# Patient Record
Sex: Female | Born: 1995 | Race: White | Hispanic: No | Marital: Single | Smoking: Former smoker
Health system: Southern US, Community
[De-identification: ages and names within clinical notes are randomized; demographics above are authoritative.]

## PROBLEM LIST (undated history)

## (undated) DIAGNOSIS — F419 Anxiety disorder, unspecified: Secondary | ICD-10-CM

## (undated) DIAGNOSIS — F32A Depression, unspecified: Secondary | ICD-10-CM

---
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
## 2019-02-13 ENCOUNTER — Ambulatory Visit: Payer: Self-pay | Admitting: Family Medicine

## 2020-06-12 ENCOUNTER — Other Ambulatory Visit: Payer: Self-pay

## 2020-06-12 ENCOUNTER — Emergency Department (HOSPITAL_COMMUNITY)
Admission: EM | Admit: 2020-06-12 | Discharge: 2020-06-13 | Disposition: A | Discharge status: Other Acute Inpatient Hospital | Payer: Self-pay | Attending: Emergency Medicine | Admitting: Emergency Medicine

## 2020-06-12 ENCOUNTER — Encounter (HOSPITAL_COMMUNITY): Payer: Self-pay

## 2020-06-12 DIAGNOSIS — Z20822 Contact with and (suspected) exposure to covid-19: Secondary | ICD-10-CM | POA: Insufficient documentation

## 2020-06-12 DIAGNOSIS — R45851 Suicidal ideations: Secondary | ICD-10-CM | POA: Insufficient documentation

## 2020-06-12 DIAGNOSIS — T50902A Poisoning by unspecified drugs, medicaments and biological substances, intentional self-harm, initial encounter: Secondary | ICD-10-CM

## 2020-06-12 DIAGNOSIS — T50992A Poisoning by other drugs, medicaments and biological substances, intentional self-harm, initial encounter: Secondary | ICD-10-CM | POA: Insufficient documentation

## 2020-06-12 DIAGNOSIS — F909 Attention-deficit hyperactivity disorder, unspecified type: Secondary | ICD-10-CM | POA: Insufficient documentation

## 2020-06-12 DIAGNOSIS — Z23 Encounter for immunization: Secondary | ICD-10-CM | POA: Insufficient documentation

## 2020-06-12 DIAGNOSIS — Z79899 Other long term (current) drug therapy: Secondary | ICD-10-CM | POA: Insufficient documentation

## 2020-06-12 LAB — COMPREHENSIVE METABOLIC PANEL
ALT: 10 U/L (ref 0–44)
AST: 16 U/L (ref 15–41)
Albumin: 4.2 g/dL (ref 3.5–5.0)
Alkaline Phosphatase: 55 U/L (ref 38–126)
Anion gap: 8 (ref 5–15)
BUN: 11 mg/dL (ref 6–20)
CO2: 24 mmol/L (ref 22–32)
Calcium: 9.2 mg/dL (ref 8.9–10.3)
Chloride: 108 mmol/L (ref 98–111)
Creatinine, Ser: 0.6 mg/dL (ref 0.44–1.00)
GFR calc Af Amer: >60 mL/min (ref >60)
GFR calc non Af Amer: >60 mL/min (ref >60)
Glucose, Bld: 113 mg/dL — ABNORMAL HIGH (ref 70–99)
Potassium: 3.7 mmol/L (ref 3.5–5.1)
Sodium: 140 mmol/L (ref 135–145)
Total Bilirubin: 0.3 mg/dL (ref 0.3–1.2)
Total Protein: 6.8 g/dL (ref 6.5–8.1)

## 2020-06-12 LAB — CBC WITH DIFFERENTIAL/PLATELET
Abs Immature Granulocytes: 0.03 10*3/uL (ref 0.00–0.07)
Basophils Absolute: 0.1 10*3/uL (ref 0.0–0.1)
Basophils Relative: 1 %
Eosinophils Absolute: 0.1 10*3/uL (ref 0.0–0.5)
Eosinophils Relative: 1 %
HCT: 35.3 % — ABNORMAL LOW (ref 36.0–46.0)
Hemoglobin: 11.7 g/dL — ABNORMAL LOW (ref 12.0–15.0)
Immature Granulocytes: 0 %
Lymphocytes Relative: 27 %
Lymphs Abs: 2.5 10*3/uL (ref 0.7–4.0)
MCH: 30.9 pg (ref 26.0–34.0)
MCHC: 33.1 g/dL (ref 30.0–36.0)
MCV: 93.1 fL (ref 80.0–100.0)
Monocytes Absolute: 0.7 10*3/uL (ref 0.1–1.0)
Monocytes Relative: 8 %
Neutro Abs: 5.7 10*3/uL (ref 1.7–7.7)
Neutrophils Relative %: 63 %
Platelets: 258 10*3/uL (ref 150–400)
RBC: 3.79 MIL/uL — ABNORMAL LOW (ref 3.87–5.11)
RDW: 12.1 % (ref 11.5–15.5)
WBC: 9 10*3/uL (ref 4.0–10.5)
nRBC: 0 % (ref 0.0–0.2)

## 2020-06-12 LAB — RAPID URINE DRUG SCREEN, HOSP PERFORMED
Amphetamines: NOT DETECTED
Barbiturates: NOT DETECTED
Benzodiazepines: NOT DETECTED
Cocaine: NOT DETECTED
Opiates: NOT DETECTED
Tetrahydrocannabinol: NOT DETECTED

## 2020-06-12 LAB — SARS CORONAVIRUS 2 BY RT PCR (HOSPITAL ORDER, PERFORMED IN ~~LOC~~ HOSPITAL LAB): SARS Coronavirus 2: NEGATIVE

## 2020-06-12 LAB — I-STAT BETA HCG BLOOD, ED (MC, WL, AP ONLY): I-stat hCG, quantitative: <5 m[IU]/mL (ref <5)

## 2020-06-12 LAB — ETHANOL: Alcohol, Ethyl (B): <10 mg/dL (ref <10)

## 2020-06-12 LAB — ACETAMINOPHEN LEVEL
Acetaminophen (Tylenol), Serum: <10 ug/mL — ABNORMAL LOW (ref 10–30)
Acetaminophen (Tylenol), Serum: <10 ug/mL — ABNORMAL LOW (ref 10–30)

## 2020-06-12 LAB — SALICYLATE LEVEL: Salicylate Lvl: <7 mg/dL — ABNORMAL LOW (ref 7.0–30.0)

## 2020-06-12 MED ORDER — BACITRACIN ZINC 500 UNIT/GM EX OINT
TOPICAL_OINTMENT | Freq: Two times a day (BID) | CUTANEOUS | Status: DC
  Administered 2020-06-12 – 2020-06-13 (×3): 1 via TOPICAL
  Filled 2020-06-12 (×2): qty 0.9

## 2020-06-12 MED ORDER — ACETAMINOPHEN 325 MG PO TABS
650.0000 mg | ORAL_TABLET | Freq: Once | ORAL | Status: AC
  Administered 2020-06-12: 650 mg via ORAL
  Filled 2020-06-12: qty 2

## 2020-06-12 MED ORDER — LORAZEPAM 2 MG/ML IJ SOLN
0.5000 mg | Freq: Once | INTRAMUSCULAR | Status: AC
  Administered 2020-06-12: 0.5 mg via INTRAVENOUS
  Filled 2020-06-12: qty 1

## 2020-06-12 MED ORDER — NICOTINE 7 MG/24HR TD PT24
7.0000 mg | MEDICATED_PATCH | Freq: Once | TRANSDERMAL | Status: AC
  Administered 2020-06-12: 7 mg via TRANSDERMAL
  Filled 2020-06-12: qty 1

## 2020-06-12 MED ORDER — ONDANSETRON HCL 4 MG/2ML IJ SOLN
4.0000 mg | Freq: Once | INTRAMUSCULAR | Status: AC
  Administered 2020-06-12: 4 mg via INTRAVENOUS
  Filled 2020-06-12: qty 2

## 2020-06-12 MED ORDER — SODIUM CHLORIDE 0.9 % IV BOLUS
1000.0000 mL | Freq: Once | INTRAVENOUS | Status: AC
  Administered 2020-06-12: 1000 mL via INTRAVENOUS

## 2020-06-12 MED ORDER — TETANUS-DIPHTH-ACELL PERTUSSIS 5-2.5-18.5 LF-MCG/0.5 IM SUSP
0.5000 mL | Freq: Once | INTRAMUSCULAR | Status: AC
  Administered 2020-06-12: 0.5 mL via INTRAMUSCULAR
  Filled 2020-06-12: qty 0.5

## 2020-06-12 MED ORDER — CHARCOAL ACTIVATED PO LIQD
50.0000 g | Freq: Once | ORAL | Status: AC
  Administered 2020-06-12: 50 g via ORAL
  Filled 2020-06-12: qty 240

## 2020-06-12 NOTE — ED Provider Notes (Signed)
[facility]-EMERGENCY DEPT Provider Note   CSN: [id number] Arrival date & time: 06/12/20  0041     History Chief Complaint  Patient presents with  . Drug Overdose    Kristen Donovan is a 24 y.o. female.  HPI   24 year old female with history of anxiety/depression, ADHD, who presents to the emergency department today for evaluation of intentional overdose.  She has had a really hard week and became overwhelmed tonight so she took a bottle of her vyvanse just PTA. States she is not sure how many she took. Denies coingestions and denies ETOH. Also reports that she cut her left wrist PTA as well. Denies other self harm. Denies any sxs at this time.   Rx filled 12/2019, Vyvanse 40mg .   History reviewed. No pertinent past medical history.  There are no problems to display for this patient.   History reviewed. No pertinent surgical history.   OB History   No obstetric history on file.     No family history on file.  Social History   Tobacco Use  . Smoking status: Never Smoker  Substance Use Topics  . Alcohol use: Not on file  . Drug use: Not on file    Home Medications Prior to Admission medications   Medication Sig Start Date End Date Taking? Authorizing Provider  amphetamine-dextroamphetamine (ADDERALL) 5 MG tablet Take 10 mg by mouth daily.  07/23/19  Yes [provider]  lisdexamfetamine (VYVANSE) 40 MG capsule Take 40 mg by mouth every morning.  07/23/19  Yes [provider]  albuterol (PROVENTIL HFA;VENTOLIN HFA) 108 (90 BASE) MCG/ACT inhaler Inhale 2 puffs into the lungs every 4 (four) hours as needed for wheezing (cough, shortness of breath or wheezing.). Patient not taking: Reported on 06/12/2020 09/27/12   09/29/12, MD  cimetidine (TAGAMET) 400 MG tablet Take 2 tablets (800 mg total) by mouth at bedtime. Patient not taking: Reported on 06/12/2020 09/27/12   09/29/12, MD  cyproheptadine (PERIACTIN) 4 MG tablet  Take 1 tablet (4 mg total) by mouth 4 (four) times daily. Patient not taking: Reported on 06/12/2020 09/27/12   09/29/12, MD  Spacer/Aero-Holding Carmelina Dane Use with MDI as directed Patient not taking: Reported on 06/12/2020 09/27/12   09/29/12, MD    Allergies    Patient has no known allergies.  Review of Systems   Review of Systems  Constitutional: Negative for fever.  HENT: Negative for ear pain and sore throat.   Eyes: Negative for visual disturbance.  Respiratory: Negative for cough and shortness of breath.   Cardiovascular: Negative for chest pain.  Gastrointestinal: Negative for abdominal pain, constipation, diarrhea, nausea and vomiting.  Genitourinary: Negative for dysuria and hematuria.  Musculoskeletal: Negative for back pain.  Skin: Negative for rash.  Neurological: Negative for headaches.  Psychiatric/Behavioral: Positive for self-injury and suicidal ideas.  All other systems reviewed and are negative.   Physical Exam Updated Vital Signs BP (!) 152/90   Pulse 63   Temp 98.2 F (36.8 C) (Oral)   Resp (!) 32   Ht 5\' 5"  (1.651 m)   Wt 61.2 kg   SpO2 98%   BMI 22.47 kg/m   Physical Exam Vitals and nursing note reviewed.  Constitutional:      General: She is not in acute distress.    Appearance: She is well-developed.  HENT:     Head: Normocephalic and atraumatic.  Eyes:     Conjunctiva/sclera: Conjunctivae normal.  Cardiovascular:  Rate and Rhythm: Normal rate and regular rhythm.     Pulses: Normal pulses.     Heart sounds: Normal heart sounds. No murmur heard.   Pulmonary:     Effort: Pulmonary effort is normal. No respiratory distress.     Breath sounds: Normal breath sounds. No wheezing, rhonchi or rales.  Abdominal:     General: Bowel sounds are normal.     Palpations: Abdomen is soft.     Tenderness: There is no abdominal tenderness. There is no guarding or rebound.  Musculoskeletal:     Cervical back: Neck supple.   Skin:    General: Skin is warm and dry.  Neurological:     Mental Status: She is alert.  Psychiatric:        Attention and Perception: Attention normal.        Mood and Affect: Affect is tearful.        Speech: Speech normal.        Behavior: Behavior is cooperative.        Thought Content: Thought content includes suicidal ideation. Thought content does not include homicidal ideation. Thought content includes suicidal plan. Thought content does not include homicidal plan.        Judgment: Judgment is impulsive.     ED Results / Procedures / Treatments   Labs (all labs ordered are listed, but only abnormal results are displayed) Labs Reviewed  CBC WITH DIFFERENTIAL/PLATELET - Abnormal; Notable for the following components:      Result Value   RBC 3.79 (*)    Hemoglobin 11.7 (*)    HCT 35.3 (*)    All other components within normal limits  COMPREHENSIVE METABOLIC PANEL - Abnormal; Notable for the following components:   Glucose, Bld 113 (*)    All other components within normal limits  ACETAMINOPHEN LEVEL - Abnormal; Notable for the following components:   Acetaminophen (Tylenol), Serum <10 (*)    All other components within normal limits  SALICYLATE LEVEL - Abnormal; Notable for the following components:   Salicylate Lvl <7.0 (*)    All other components within normal limits  SARS CORONAVIRUS 2 BY RT PCR (HOSPITAL ORDER, PERFORMED IN [facility])  ETHANOL  RAPID URINE DRUG SCREEN, HOSP PERFORMED  ACETAMINOPHEN LEVEL  I-STAT BETA HCG BLOOD, ED ([facility], [facility], [facility] ONLY)    EKG EKG Interpretation  Date/Time:  Friday June 12 2020 01:19:21 EDT Ventricular Rate:  58 PR Interval:    QRS Duration: 103 QT Interval:  407 QTC Calculation: 400 R Axis:   78 Text Interpretation: Sinus rhythm Atrial premature complex Borderline short PR interval No previous ECGs available Confirmed by Glynn Octave [id number]) on 06/12/2020 1:22:10 AM   Radiology No results  found.  Procedures Procedures (including critical care time) CRITICAL CARE Performed by: Karrie Meres   Total critical care time: 40 minutes  Critical care time was exclusive of separately billable procedures and treating other patients.  Critical care was necessary to treat or prevent imminent or life-threatening deterioration.  Critical care was time spent personally by me on the following activities: development of treatment plan with patient and/or surrogate as well as nursing, discussions with consultants, evaluation of patient's response to treatment, examination of patient, obtaining history from patient or surrogate, ordering and performing treatments and interventions, ordering and review of laboratory studies, ordering and review of radiographic studies, pulse oximetry and re-evaluation of patient's condition.   Medications Ordered in ED Medications  nicotine (NICODERM CQ - dosed  in mg/24 hr) patch 7 mg (7 mg Transdermal Patch Applied 06/12/20 0129)  bacitracin ointment (1 application Topical Given 06/12/20 0249)  sodium chloride 0.9 % bolus 1,000 mL (0 mLs Intravenous Stopped 06/12/20 0242)  charcoal activated (NO SORBITOL) (ACTIDOSE-AQUA) suspension 50 g (50 g Oral Given 06/12/20 0130)  ondansetron (ZOFRAN) injection 4 mg (4 mg Intravenous Given 06/12/20 0148)  Tdap (BOOSTRIX) injection 0.5 mL (0.5 mLs Intramuscular Given 06/12/20 0231)  acetaminophen (TYLENOL) tablet 650 mg (650 mg Oral Given 06/12/20 0253)  LORazepam (ATIVAN) injection 0.5 mg (0.5 mg Intravenous Given 06/12/20 0250)    ED Course  I have reviewed the triage vital signs and the nursing notes.  Pertinent labs & imaging results that were available during my care of the patient were reviewed by me and considered in my medical decision making (see chart for details).    MDM Rules/Calculators/A&P                          24 year old female presenting for evaluation of overdose with Vyvanse prior to arrival and  attempted suicide.  Also cut her left wrist.  12:55 PM Discussed case with Caryn Bee from poison control. He recommends giving a dose of activated charcoal 1g/kg. If EKG WNL can give zofran with it. Monitor for tachycardia, seizures, tremors, agitation. Can give benzos for this if sxs. Recommended aggressive hydration and 8 hour obs until asymptomatic.   Reviewed/interpreted labs. CBC without leukocytosis, mild anemia present CMP within normal limits Acetaminophen negative, salicylate negative EtOH negative UDS negative Beta-hCG negative  230 AM. C/o headache. Neuro exam wnl. BP somewhat elevated. Will order tylenol and ativan.   3:20 AM rechecked pt. Sleeping comfortably but easily arousable. States HA is improved. BP improving. Remains neuro intact.   Multiple rechecks completed and pt continues to be HA free and asymptomatic. HR and BP normalized.   At shift change, pt pending repeat Tylenol level and recheck after 8 hour observation period. If asymptomatic, she can be medically cleared for TTS eval at 830AM. Care transitioned to [facility], PA-C.  Final Clinical Impression(s) / ED Diagnoses Final diagnoses:  Intentional drug overdose, initial encounter [facility])    Rx / DC Orders ED Discharge Orders    None       Rayne Du 06/12/20 0612    Glynn Octave, MD 06/12/20 5366    Glynn Octave, MD 06/12/20 [phone]
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
## 2020-06-12 NOTE — ED Notes (Signed)
Red splotchy rash noted across pts neck and shoulders. Pt has not complaints of itchyness, or pain. Provider notified.

## 2020-06-12 NOTE — ED Notes (Signed)
Spoke to patients mother @ [phone].  Mother would like for patient to call her later.

## 2020-06-12 NOTE — Progress Notes (Signed)
Per Caryn Bee, DNP, this pt requires psychiatric hospitalization at this time.  At [address] [place] calls from [facility].  calls from [facility].  Pt has been accepted to their facility by Dr. Angela Burke to room 814-A. Marland KitchenFredna Dow, concurs with this disposition, as does the pt who is currently under voluntary status.  Pt's nurse will be notified, and agrees to call report to [phone].

## 2020-06-12 NOTE — ED Notes (Signed)
Patients visitor at bedside. Father.

## 2020-06-12 NOTE — BH Assessment (Addendum)
[facility] Assessment Progress Note  Per Caryn Bee, DNP, this pt requires psychiatric hospitalization at this time.  Pt presents under IVC initiated by law enforcement and upheld by EDP Glynn Octave, MD.  The following facilities have been contacted to seek placement for this pt, with results as noted:  Beds available, information sent, decision pending: [facility] Loyal Jacobson, [place] [facility] Coordinator [phone]

## 2020-06-12 NOTE — ED Notes (Addendum)
Patient belong bag-1 placed in 16-18 cabinets.   IVC paper work at nurses station in [employer] B bin.

## 2020-06-12 NOTE — ED Notes (Signed)
Patient speaking with TSS at this time. Patient calm and cooperative.    Spoke with patients father and updated on plan of care.

## 2020-06-12 NOTE — ED Provider Notes (Signed)
Pt is a 24 y/o female that was transferred to me at shift change from [employer]. Her HPI is below:  24 year old female with history of anxiety/depression, ADHD, who presents to the emergency department today for evaluation of intentional overdose.  She has had a really hard week and became overwhelmed tonight so she took a bottle of her vyvanse just PTA. States she is not sure how many she took. Denies coingestions and denies ETOH. Also reports that she cut her left wrist PTA as well. Denies other self harm. Denies any sxs at this time.   Rx filled 12/2019, Vyvanse 40mg .   Physical Exam  BP 140/82    Pulse 76    Temp 98.2 F (36.8 C) (Oral)    Resp 14    Ht 5\' 5"  (1.651 m)    Wt 61.2 kg    SpO2 100%    BMI 22.47 kg/m   Physical Exam Physical Exam Vitals and nursing note reviewed.  Constitutional:      General: She is not in acute distress.    Appearance: She is well-developed.  HENT:     Head: Normocephalic and atraumatic.  Eyes:     Conjunctiva/sclera: Conjunctivae normal.  Cardiovascular:     Rate and Rhythm: Normal rate and regular rhythm.     Pulses: Normal pulses.     Heart sounds: Normal heart sounds. No murmur heard.   Pulmonary:     Effort: Pulmonary effort is normal. No respiratory distress.     Breath sounds: Normal breath sounds. No wheezing, rhonchi or rales.  Abdominal:     General: Bowel sounds are normal.     Palpations: Abdomen is soft.     Tenderness: There is no abdominal tenderness. There is no guarding or rebound.  Musculoskeletal:     Cervical back: Neck supple.  Skin:    General: Skin is warm and dry.  Neurological:     Mental Status: She is alert.  Psychiatric:        Attention and Perception: Attention normal.        Mood and Affect: Affect is tearful.        Speech: Speech normal.        Behavior: Behavior is cooperative.        Thought Content: Thought content includes suicidal ideation. Thought content does not include homicidal  ideation. Thought content includes suicidal plan. Thought content does not include homicidal plan.        Judgment: Judgment is impulsive.  ED Course/Procedures     Procedures  MDM  Patient is a 24 year old female that was transferred to me at shift change from .  Poison control was contacted regarding this patient and she was monitored in the emergency department at their request.  No elevation in her acetaminophen level x2.  Patient was initially hypertensive and tachycardic, which is since improved.  She is an IVC patient.  After she was medically cleared, I consulted TTS.  They assessed the patient and 21, NP has reviewed the patient's information and determined that she meets inpatient criteria.  She is currently being reviewed at [facility].   Note: Portions of this report may have been transcribed using voice recognition software. Every effort was made to ensure accuracy; however, inadvertent computerized transcription errors may be present.        Malachy Chamber, PA-C 06/12/20 1034    Placido Sou, MD 06/12/20 1539

## 2020-06-12 NOTE — ED Notes (Signed)
Buffi Ewton, mother, [phone] wants an update on her daughter. Has not been contacted as of yet.

## 2020-06-12 NOTE — ED Notes (Signed)
Spoke with patients father who would like to be notified once patient is transferred to inpatient [facility].   [phone] Laddie Aquas, Father).

## 2020-06-12 NOTE — ED Notes (Signed)
Patient speaking with mother on the phone.

## 2020-06-12 NOTE — ED Notes (Signed)
Father at bedside visiting patient. Patient cam and cooperative at this time.   Patients visitor (Father) wanded and belongings placed in cabinet by security.

## 2020-06-12 NOTE — ED Notes (Signed)
Sheriff was called for transport to [facility] and I was told that they could not transport until morning.  They asked that we call during the night to leave a message for the morning transport.
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
## 2020-06-12 NOTE — ED Notes (Addendum)
Poison Control states that if patient has a level of tylenol of 150 mg that she can be cleared.

## 2020-06-12 NOTE — Progress Notes (Signed)
CSW updated [facility] pt cannot transport until morning.  [facility] voiced understanding.  CSW will continue to follow for D/C needs.  Dorothe Pea. Amadi Yoshino  MSW, LCSW, LCAS, CCS Transitions of Care Clinical Social Worker Care Coordination Department Ph: [phone]

## 2020-06-13 MED ORDER — NICOTINE 7 MG/24HR TD PT24
7.0000 mg | MEDICATED_PATCH | Freq: Once | TRANSDERMAL | Status: DC
  Administered 2020-06-13: 7 mg via TRANSDERMAL
  Filled 2020-06-13: qty 1

## 2020-06-13 NOTE — Progress Notes (Signed)
06/13/2020  0735  Called Sheriff [phone] to transport patient to [facility], message left on voicemail. Waiting for return call from sheriff dept.

## 2020-06-13 NOTE — Progress Notes (Signed)
06/13/2020  3709  Sheriff called stating they will be here in 30 minutes to transport patient to [facility].

## 2020-06-13 NOTE — ED Notes (Signed)
Transport called and message left for patient transport in AM, Pending [facility] hospital placement.

## 2020-06-13 NOTE — Progress Notes (Signed)
06/13/2020  6599  Called report to [facility] [phone]. Report given to [facility].

## 2020-08-25 ENCOUNTER — Emergency Department (HOSPITAL_COMMUNITY): Payer: Commercial Managed Care - PPO

## 2020-08-25 ENCOUNTER — Encounter (HOSPITAL_COMMUNITY): Payer: Self-pay

## 2020-08-25 ENCOUNTER — Emergency Department (HOSPITAL_COMMUNITY)
Admission: EM | Admit: 2020-08-25 | Discharge: 2020-08-25 | Disposition: A | Discharge status: Home / Self Care | Payer: Commercial Managed Care - PPO | Attending: Emergency Medicine | Admitting: Emergency Medicine

## 2020-08-25 ENCOUNTER — Other Ambulatory Visit: Payer: Self-pay

## 2020-08-25 DIAGNOSIS — Z20822 Contact with and (suspected) exposure to covid-19: Secondary | ICD-10-CM | POA: Insufficient documentation

## 2020-08-25 DIAGNOSIS — N3000 Acute cystitis without hematuria: Secondary | ICD-10-CM | POA: Diagnosis not present

## 2020-08-25 DIAGNOSIS — R1031 Right lower quadrant pain: Secondary | ICD-10-CM | POA: Diagnosis present

## 2020-08-25 LAB — URINALYSIS, ROUTINE W REFLEX MICROSCOPIC
Bilirubin Urine: NEGATIVE
Glucose, UA: NEGATIVE mg/dL
Ketones, ur: 5 mg/dL — AB
Nitrite: POSITIVE — AB
Protein, ur: 100 mg/dL — AB
Specific Gravity, Urine: 1.012 (ref 1.005–1.030)
pH: 7 (ref 5.0–8.0)

## 2020-08-25 LAB — CBC
HCT: 39.9 % (ref 36.0–46.0)
Hemoglobin: 13.1 g/dL (ref 12.0–15.0)
MCH: 30.8 pg (ref 26.0–34.0)
MCHC: 32.8 g/dL (ref 30.0–36.0)
MCV: 93.9 fL (ref 80.0–100.0)
Platelets: 279 10*3/uL (ref 150–400)
RBC: 4.25 MIL/uL (ref 3.87–5.11)
RDW: 12 % (ref 11.5–15.5)
WBC: 7.9 10*3/uL (ref 4.0–10.5)
nRBC: 0 % (ref 0.0–0.2)

## 2020-08-25 LAB — COMPREHENSIVE METABOLIC PANEL
ALT: 15 U/L (ref 0–44)
AST: 19 U/L (ref 15–41)
Albumin: 4.9 g/dL (ref 3.5–5.0)
Alkaline Phosphatase: 73 U/L (ref 38–126)
Anion gap: 10 (ref 5–15)
BUN: 9 mg/dL (ref 6–20)
CO2: 24 mmol/L (ref 22–32)
Calcium: 9.4 mg/dL (ref 8.9–10.3)
Chloride: 104 mmol/L (ref 98–111)
Creatinine, Ser: 0.64 mg/dL (ref 0.44–1.00)
GFR calc Af Amer: >60 mL/min (ref >60)
GFR calc non Af Amer: >60 mL/min (ref >60)
Glucose, Bld: 94 mg/dL (ref 70–99)
Potassium: 3.7 mmol/L (ref 3.5–5.1)
Sodium: 138 mmol/L (ref 135–145)
Total Bilirubin: 0.5 mg/dL (ref 0.3–1.2)
Total Protein: 8.3 g/dL — ABNORMAL HIGH (ref 6.5–8.1)

## 2020-08-25 LAB — I-STAT BETA HCG BLOOD, ED (MC, WL, AP ONLY): I-stat hCG, quantitative: <5 m[IU]/mL (ref <5)

## 2020-08-25 LAB — LIPASE, BLOOD: Lipase: 28 U/L (ref 11–51)

## 2020-08-25 LAB — SARS CORONAVIRUS 2 BY RT PCR (HOSPITAL ORDER, PERFORMED IN ~~LOC~~ HOSPITAL LAB): SARS Coronavirus 2: NEGATIVE

## 2020-08-25 MED ORDER — PHENAZOPYRIDINE HCL 95 MG PO TABS: 95.0000 mg | ORAL_TABLET | Freq: Three times a day (TID) | ORAL | 0 refills | Status: DC | PRN

## 2020-08-25 MED ORDER — ONDANSETRON HCL 4 MG/2ML IJ SOLN
4.0000 mg | Freq: Once | INTRAMUSCULAR | Status: AC
  Administered 2020-08-25: 4 mg via INTRAVENOUS
  Filled 2020-08-25: qty 2

## 2020-08-25 MED ORDER — CEPHALEXIN 500 MG PO CAPS: 500.0000 mg | ORAL_CAPSULE | Freq: Three times a day (TID) | ORAL | 0 refills | Status: AC

## 2020-08-25 MED ORDER — MORPHINE SULFATE (PF) 4 MG/ML IV SOLN
4.0000 mg | Freq: Once | INTRAVENOUS | Status: AC
  Administered 2020-08-25: 4 mg via INTRAVENOUS
  Filled 2020-08-25: qty 1

## 2020-08-25 MED ORDER — ONDANSETRON 4 MG PO TBDP: 4.0000 mg | ORAL_TABLET | Freq: Three times a day (TID) | ORAL | 0 refills | Status: DC | PRN

## 2020-08-25 MED ORDER — KETOROLAC TROMETHAMINE 30 MG/ML IJ SOLN
30.0000 mg | Freq: Once | INTRAMUSCULAR | Status: AC
  Administered 2020-08-25: 30 mg via INTRAVENOUS
  Filled 2020-08-25: qty 1

## 2020-08-25 MED ORDER — CEPHALEXIN 500 MG PO CAPS
500.0000 mg | ORAL_CAPSULE | Freq: Once | ORAL | Status: AC
  Administered 2020-08-25: 500 mg via ORAL
  Filled 2020-08-25: qty 1

## 2020-08-25 MED ORDER — IOHEXOL 300 MG/ML  SOLN
100.0000 mL | Freq: Once | INTRAMUSCULAR | Status: AC | PRN
  Administered 2020-08-25: 100 mL via INTRAVENOUS

## 2020-08-25 MED ORDER — SODIUM CHLORIDE 0.9 % IV BOLUS
1000.0000 mL | Freq: Once | INTRAVENOUS | Status: AC
  Administered 2020-08-25: 1000 mL via INTRAVENOUS

## 2020-08-25 NOTE — Discharge Instructions (Signed)
Take the mediation as prescribed.  Follow up with Primary care provider  Return for new or worsening symptoms

## 2020-08-25 NOTE — ED Triage Notes (Signed)
Pt presents with c/o right lower quadrant abdominal pain since yesterday. Pt went to UC and was sent here to rule out appendicitis.

## 2020-08-25 NOTE — ED Provider Notes (Signed)
[facility]-EMERGENCY DEPT Provider Note   CSN: [id number] Arrival date & time: 08/25/20  1426    History Chief Complaint  Patient presents with  . Abdominal Pain    Kristen Donovan is a 24 y.o. female with past medical history significant for intentional overdose 2 months ago who presents for evaluation of abdominal pain.  Pain located to right lower quadrant. Began yesterday. Seen by UC and sent by UC to r/o appendicitis. Rates pain a 8/10.  Patient thought she had a urinary tract infection yesterday due to the pain.  She does not have a history of urinary tract infections.  She denies any dysuria, urinary frequency, hematuria.  States she did take 1 dose of Azo yesterday.  She is sexually active.  Denies chance of pregnancy.  Denies any pelvic pain, vaginal discharge, denies any concerns for STDs.  Pain is constant in nature.  States will occasionally radiate into her lower back.  Patient states her pain is located between her upper and lower quadrants on the right-hand side.  She has never had anything like this previously.  Had 1 episode of NBNB emesis which she states was due to the pain yesterday.  No prior abdominal surgeries.  Her last bowel movement was this morning.  She denies any diarrhea or constipation.  She is passing flatulence.  Denies fever, chills, chest pain, shortness of breath, rashes, lesions.  Denies additional aggravating or alleviating factors.  History obtained from patient and past medical records.  No interpreter used.  HPI     History reviewed. No pertinent past medical history.  There are no problems to display for this patient.   History reviewed. No pertinent surgical history.   OB History   No obstetric history on file.     History reviewed. No pertinent family history.  Social History   Tobacco Use  . Smoking status: Never Smoker  Substance Use Topics  . Alcohol use: Not on file  . Drug use: Not on file    Home  Medications Prior to Admission medications   Medication Sig Start Date End Date Taking? Authorizing Provider  buPROPion (WELLBUTRIN XL) 150 MG 24 hr tablet Take 150 mg by mouth daily.   Yes [provider]  albuterol (PROVENTIL HFA;VENTOLIN HFA) 108 (90 BASE) MCG/ACT inhaler Inhale 2 puffs into the lungs every 4 (four) hours as needed for wheezing (cough, shortness of breath or wheezing.). Patient not taking: Reported on 06/12/2020 09/27/12   Carmelina Dane, MD  cephALEXin (KEFLEX) 500 MG capsule Take 1 capsule (500 mg total) by mouth 3 (three) times daily for 7 days. 08/25/20 09/01/20  Loralye Loberg A, PA-C  cimetidine (TAGAMET) 400 MG tablet Take 2 tablets (800 mg total) by mouth at bedtime. Patient not taking: Reported on 06/12/2020 09/27/12   Carmelina Dane, MD  cyproheptadine (PERIACTIN) 4 MG tablet Take 1 tablet (4 mg total) by mouth 4 (four) times daily. Patient not taking: Reported on 06/12/2020 09/27/12   Carmelina Dane, MD  ondansetron (ZOFRAN ODT) 4 MG disintegrating tablet Take 1 tablet (4 mg total) by mouth every 8 (eight) hours as needed for nausea or vomiting. 08/25/20   Orel Hord A, PA-C  phenazopyridine (PYRIDIUM) 95 MG tablet Take 1 tablet (95 mg total) by mouth 3 (three) times daily as needed for pain. 08/25/20   Monique Gift A, PA-C  Spacer/Aero-Holding Rudean Curt Use with MDI as directed Patient not taking: Reported on 06/12/2020 09/27/12   Carmelina Dane,  MD    Allergies    Patient has no known allergies.  Review of Systems   Review of Systems  Constitutional: Negative.   HENT: Negative.   Respiratory: Negative.   Cardiovascular: Negative.   Gastrointestinal: Positive for abdominal pain, nausea and vomiting. Negative for abdominal distention, anal bleeding, blood in stool, constipation, diarrhea and rectal pain.  Genitourinary: Negative.   Musculoskeletal: Negative.   Skin: Negative.   Neurological: Negative.   All other systems  reviewed and are negative.   Physical Exam Updated Vital Signs BP 132/78 (BP Location: Left Arm)   Pulse 91   Temp 98.6 F (37 C) (Oral)   Resp 18   LMP 08/11/2020 (Approximate)   SpO2 100%   Physical Exam Vitals and nursing note reviewed.  Constitutional:      General: She is not in acute distress.    Appearance: She is well-developed. She is not ill-appearing, toxic-appearing or diaphoretic.  HENT:     Head: Normocephalic and atraumatic.     Mouth/Throat:     Mouth: Mucous membranes are moist.  Eyes:     Pupils: Pupils are equal, round, and reactive to light.  Cardiovascular:     Rate and Rhythm: Normal rate.     Heart sounds: Normal heart sounds.  Pulmonary:     Effort: Pulmonary effort is normal. No respiratory distress.     Breath sounds: Normal breath sounds.     Comments: Speaks in full sentences without difficulty. Lungs clear to auscultation bilaterally. Abdominal:     General: Bowel sounds are normal. There is no distension.     Palpations: Abdomen is soft.     Tenderness: There is abdominal tenderness in the right lower quadrant. There is no right CVA tenderness, left CVA tenderness, guarding or rebound. Negative signs include Murphy's sign.     Comments: Tenderness to RLQ and mid right abdomen diffusely. No rebound or guarding. No gross abd wall hernias.  Musculoskeletal:        General: Normal range of motion.     Cervical back: Normal range of motion.     Comments: Moves all 4 extremities without difficulty. Compartments soft. No bony tenderness  Skin:    General: Skin is warm and dry.     Capillary Refill: Capillary refill takes less than 2 seconds.     Comments: Scattered ecchymosis appear old to extremities. No edema, erythema, warmth  Neurological:     General: No focal deficit present.     Mental Status: She is alert.     Gait: Gait is intact.     Comments: CN 2-12 grossly intact. Ambulatory wo difficulty    ED Results / Procedures / Treatments    Labs (all labs ordered are listed, but only abnormal results are displayed) Labs Reviewed  COMPREHENSIVE METABOLIC PANEL - Abnormal; Notable for the following components:      Result Value   Total Protein 8.3 (*)    All other components within normal limits  URINALYSIS, ROUTINE W REFLEX MICROSCOPIC - Abnormal; Notable for the following components:   Color, Urine AMBER (*)    Hgb urine dipstick SMALL (*)    Ketones, ur 5 (*)    Protein, ur 100 (*)    Nitrite POSITIVE (*)    Leukocytes,Ua MODERATE (*)    Bacteria, UA FEW (*)    All other components within normal limits  SARS CORONAVIRUS 2 BY RT PCR (HOSPITAL ORDER, PERFORMED IN [facility])  LIPASE, BLOOD  CBC  I-STAT BETA HCG BLOOD, ED ([facility], [facility], [facility] ONLY)    EKG None  Radiology US Transvaginal Non-OB  Result Date: 08/25/2020 CLINICAL DATA:  Right lower quadrant pain EXAM: TRANSABDOMINAL AND TRANSVAGINAL ULTRASOUND OF PELVIS DOPPLER ULTRASOUND OF OVARIES TECHNIQUE: Both transabdominal and transvaginal ultrasound examinations of the pelvis were performed. Transabdominal technique was performed for global imaging of the pelvis including uterus, ovaries, adnexal regions, and pelvic cul-de-sac. It was necessary to proceed with endovaginal exam following the transabdominal exam to visualize the ovaries. Color and duplex Doppler ultrasound was utilized to evaluate blood flow to the ovaries. COMPARISON:  CT dated 08/25/2020 FINDINGS: Uterus Measurements: 7.2 x 2.4 x 3.2 cm = volume: 32 mL. No fibroids or other mass visualized. Endometrium Thickness: 6 mm.  No focal abnormality visualized. Right ovary Measurements: 3.8 x 2 x 1.8 cm = volume: 7.1 mL. Normal appearance/no adnexal mass. Left ovary Measurements: 3.5 x 2.4 x 2.8 cm = volume: 12.2 mL. Normal appearance/no adnexal mass. Pulsed Doppler evaluation of both ovaries demonstrates normal low-resistance arterial and venous waveforms. Other findings No abnormal free fluid. IMPRESSION:  Normal study. Electronically Signed   By: Katherine Mantle M.D.   On: 08/25/2020 18:41   US Pelvis Complete  Result Date: 08/25/2020 CLINICAL DATA:  Right lower quadrant pain EXAM: TRANSABDOMINAL AND TRANSVAGINAL ULTRASOUND OF PELVIS DOPPLER ULTRASOUND OF OVARIES TECHNIQUE: Both transabdominal and transvaginal ultrasound examinations of the pelvis were performed. Transabdominal technique was performed for global imaging of the pelvis including uterus, ovaries, adnexal regions, and pelvic cul-de-sac. It was necessary to proceed with endovaginal exam following the transabdominal exam to visualize the ovaries. Color and duplex Doppler ultrasound was utilized to evaluate blood flow to the ovaries. COMPARISON:  CT dated 08/25/2020 FINDINGS: Uterus Measurements: 7.2 x 2.4 x 3.2 cm = volume: 32 mL. No fibroids or other mass visualized. Endometrium Thickness: 6 mm.  No focal abnormality visualized. Right ovary Measurements: 3.8 x 2 x 1.8 cm = volume: 7.1 mL. Normal appearance/no adnexal mass. Left ovary Measurements: 3.5 x 2.4 x 2.8 cm = volume: 12.2 mL. Normal appearance/no adnexal mass. Pulsed Doppler evaluation of both ovaries demonstrates normal low-resistance arterial and venous waveforms. Other findings No abnormal free fluid. IMPRESSION: Normal study. Electronically Signed   By: Katherine Mantle M.D.   On: 08/25/2020 18:41   CT Abdomen Pelvis W Contrast  Result Date: 08/25/2020 CLINICAL DATA:  Right lower quadrant abdominal pain. EXAM: CT ABDOMEN AND PELVIS WITH CONTRAST TECHNIQUE: Multidetector CT imaging of the abdomen and pelvis was performed using the standard protocol following bolus administration of intravenous contrast. CONTRAST:  OMNIPAQUE IOHEXOL 300 MG/ML  SOLN COMPARISON:  June 13, 2016 FINDINGS: Lower chest: No acute abnormality. Hepatobiliary: No focal liver abnormality is seen. No gallstones, gallbladder wall thickening, or biliary dilatation. Pancreas: Unremarkable. No pancreatic  ductal dilatation or surrounding inflammatory changes. Spleen: Normal in size without focal abnormality. Adrenals/Urinary Tract: Adrenal glands are unremarkable. Kidneys are normal, without renal calculi, focal lesion, or hydronephrosis. Bladder is unremarkable. Stomach/Bowel: Stomach is within normal limits. Appendix appears normal. No evidence of bowel wall thickening, distention, or inflammatory changes. Vascular/Lymphatic: No significant vascular findings are present. No enlarged abdominal or pelvic lymph nodes. Reproductive: The uterus is normal in appearance. Multiple subcentimeter cysts are seen within the bilateral adnexa. Other: No abdominal wall hernia or abnormality. No abdominopelvic ascites. Musculoskeletal: No acute or significant osseous findings. IMPRESSION: 1. No CT evidence of acute or active process within the abdomen or pelvis. 2. Multiple subcentimeter  cysts within the bilateral adnexa, likely ovarian in origin. Electronically Signed   By: Aram Candela M.D.   On: 08/25/2020 17:36   [place] Art/Ven Flow Abd Pelv Doppler  Result Date: 08/25/2020 CLINICAL DATA:  Right lower quadrant pain EXAM: TRANSABDOMINAL AND TRANSVAGINAL ULTRASOUND OF PELVIS DOPPLER ULTRASOUND OF OVARIES TECHNIQUE: Both transabdominal and transvaginal ultrasound examinations of the pelvis were performed. Transabdominal technique was performed for global imaging of the pelvis including uterus, ovaries, adnexal regions, and pelvic cul-de-sac. It was necessary to proceed with endovaginal exam following the transabdominal exam to visualize the ovaries. Color and duplex Doppler ultrasound was utilized to evaluate blood flow to the ovaries. COMPARISON:  CT dated 08/25/2020 FINDINGS: Uterus Measurements: 7.2 x 2.4 x 3.2 cm = volume: 32 mL. No fibroids or other mass visualized. Endometrium Thickness: 6 mm.  No focal abnormality visualized. Right ovary Measurements: 3.8 x 2 x 1.8 cm = volume: 7.1 mL. Normal appearance/no adnexal  mass. Left ovary Measurements: 3.5 x 2.4 x 2.8 cm = volume: 12.2 mL. Normal appearance/no adnexal mass. Pulsed Doppler evaluation of both ovaries demonstrates normal low-resistance arterial and venous waveforms. Other findings No abnormal free fluid. IMPRESSION: Normal study. Electronically Signed   By: Katherine Mantle M.D.   On: 08/25/2020 18:41    Procedures Procedures (including critical care time)  Medications Ordered in ED Medications  sodium chloride 0.9 % bolus 1,000 mL (0 mLs Intravenous Stopped 08/25/20 1828)  morphine 4 MG/ML injection 4 mg (4 mg Intravenous Given 08/25/20 1537)  ondansetron (ZOFRAN) injection 4 mg (4 mg Intravenous Given 08/25/20 1550)  iohexol (OMNIPAQUE) 300 MG/ML solution 100 mL (100 mLs Intravenous Contrast Given 08/25/20 1719)  ketorolac (TORADOL) 30 MG/ML injection 30 mg (30 mg Intravenous Given 08/25/20 1930)  cephALEXin (KEFLEX) capsule 500 mg (500 mg Oral Given 08/25/20 1930)    ED Course  I have reviewed the triage vital signs and the nursing notes.  Pertinent labs & imaging results that were available during my care of the patient were reviewed by me and considered in my medical decision making (see chart for details).  24 year old presents for evaluation of right-sided abdominal pain which began yesterday.  She is afebrile, nonseptic, non-ill-appearing.  Seen by urgent care sent here to the emergency department to rule out appendicitis.  Patient states sexually active however denies chance of pregnancy.  No pelvic pain, vaginal discharge, she denies any concerns for any STDs.  Has been taking Azo at home.  Her heart and lungs are clear.  Her abdomen is diffusely tender to her right upper and lower quadrant however has negative Murphy sign.  She is neurovascularly intact.  Ambulatory without difficulty.  She does have scattered areas of ecchymosis however patient states "I bruise easily."  She denies any trauma or injuries. Plan on labs, imaging and  reassess.  Labs and imaging personally reviewed and interpreted:  CBC without leukocytosis Lipase 28 CMP no electrolyte, renal abnormality Preg negative UA positive for UTI COVID negative CT AP negative for appendicitis, bilateral adnexal cyst Ultrasound without evidence of torsion.  Patient reassessed. Pain still a 7/10 however declines additional pain meds. Patient does states that pain is now lower into her pelvis on right. Will add [place] to r/o torsion. She does not want pelvic exam at this time to r/o STD.  Patient reassessed.  Pain controlled.  She is tolerating p.o. intake without difficulty.  Pain likely related to her urinary tract infection.  CT scan and ultrasound reassuring.  Again does not  want pelvic exam to rule out STDs.  She states she will follow-up outpatient for this.  DC home with UTI and symptomatic management.  She will return for any worsening symptoms.  Patient is nontoxic, nonseptic appearing, in no apparent distress.  Patient's pain and other symptoms adequately managed in emergency department.  Fluid bolus given.  Labs, imaging and vitals reviewed.  Patient does not meet the SIRS or Sepsis criteria.  On repeat exam patient does not have a surgical abdomin and there are no peritoneal signs.  No indication of appendicitis, bowel obstruction, bowel perforation, cholecystitis, diverticulitis, PID, torsion or ectopic pregnancy.  Patient discharged home with symptomatic treatment and given strict instructions for follow-up with their primary care physician.  I have also discussed reasons to return immediately to the ER.  Patient expresses understanding and agrees with plan.     MDM Rules/Calculators/A&P                           Final Clinical Impression(s) / ED Diagnoses Final diagnoses:  Acute cystitis without hematuria    Rx / DC Orders ED Discharge Orders         Ordered    cephALEXin (KEFLEX) 500 MG capsule  3 times daily        08/25/20 1935     phenazopyridine (PYRIDIUM) 95 MG tablet  3 times daily PRN        08/25/20 1935    ondansetron (ZOFRAN ODT) 4 MG disintegrating tablet  Every 8 hours PRN        08/25/20 1937           Aidan Caloca A, PA-C 08/25/20 2235    Rolan BuccoBelfi, Melanie, MD 08/25/20 2245

## 2020-11-19 ENCOUNTER — Other Ambulatory Visit: Payer: Self-pay

## 2020-11-19 ENCOUNTER — Emergency Department (HOSPITAL_COMMUNITY)
Admission: EM | Admit: 2020-11-19 | Discharge: 2020-11-20 | Disposition: A | Discharge status: Home / Self Care | Payer: Commercial Managed Care - PPO | Attending: Emergency Medicine | Admitting: Emergency Medicine

## 2020-11-19 DIAGNOSIS — E860 Dehydration: Secondary | ICD-10-CM | POA: Insufficient documentation

## 2020-11-19 DIAGNOSIS — N72 Inflammatory disease of cervix uteri: Secondary | ICD-10-CM | POA: Insufficient documentation

## 2020-11-19 DIAGNOSIS — R1031 Right lower quadrant pain: Secondary | ICD-10-CM | POA: Diagnosis present

## 2020-11-19 LAB — URINALYSIS, ROUTINE W REFLEX MICROSCOPIC
Bilirubin Urine: NEGATIVE
Glucose, UA: NEGATIVE mg/dL
Ketones, ur: 80 mg/dL — AB
Nitrite: NEGATIVE
Protein, ur: 100 mg/dL — AB
Specific Gravity, Urine: 1.032 — ABNORMAL HIGH (ref 1.005–1.030)
pH: 5 (ref 5.0–8.0)

## 2020-11-19 LAB — COMPREHENSIVE METABOLIC PANEL
ALT: 17 U/L (ref 0–44)
AST: 21 U/L (ref 15–41)
Albumin: 4.5 g/dL (ref 3.5–5.0)
Alkaline Phosphatase: 47 U/L (ref 38–126)
Anion gap: 12 (ref 5–15)
BUN: 9 mg/dL (ref 6–20)
CO2: 21 mmol/L — ABNORMAL LOW (ref 22–32)
Calcium: 9.3 mg/dL (ref 8.9–10.3)
Chloride: 105 mmol/L (ref 98–111)
Creatinine, Ser: 0.71 mg/dL (ref 0.44–1.00)
GFR, Estimated: >60 mL/min (ref >60)
Glucose, Bld: 83 mg/dL (ref 70–99)
Potassium: 3.5 mmol/L (ref 3.5–5.1)
Sodium: 138 mmol/L (ref 135–145)
Total Bilirubin: 0.5 mg/dL (ref 0.3–1.2)
Total Protein: 7.8 g/dL (ref 6.5–8.1)

## 2020-11-19 LAB — CBC
HCT: 37.7 % (ref 36.0–46.0)
Hemoglobin: 12.4 g/dL (ref 12.0–15.0)
MCH: 30.8 pg (ref 26.0–34.0)
MCHC: 32.9 g/dL (ref 30.0–36.0)
MCV: 93.8 fL (ref 80.0–100.0)
Platelets: 292 10*3/uL (ref 150–400)
RBC: 4.02 MIL/uL (ref 3.87–5.11)
RDW: 12.3 % (ref 11.5–15.5)
WBC: 8.1 10*3/uL (ref 4.0–10.5)
nRBC: 0 % (ref 0.0–0.2)

## 2020-11-19 LAB — LIPASE, BLOOD: Lipase: 27 U/L (ref 11–51)

## 2020-11-19 LAB — WET PREP, GENITAL
Clue Cells Wet Prep HPF POC: NONE SEEN
Sperm: NONE SEEN
Trich, Wet Prep: NONE SEEN
Yeast Wet Prep HPF POC: NONE SEEN

## 2020-11-19 LAB — I-STAT BETA HCG BLOOD, ED (MC, WL, AP ONLY): I-stat hCG, quantitative: <5 m[IU]/mL (ref <5)

## 2020-11-19 MED ORDER — IBUPROFEN 600 MG PO TABS: 600.0000 mg | ORAL_TABLET | Freq: Four times a day (QID) | ORAL | 0 refills | Status: AC | PRN

## 2020-11-19 MED ORDER — SODIUM CHLORIDE 0.9 % IV BOLUS
1000.0000 mL | Freq: Once | INTRAVENOUS | Status: AC
  Administered 2020-11-19: 21:00:00 1000 mL via INTRAVENOUS

## 2020-11-19 MED ORDER — CEFTRIAXONE SODIUM 1 G IJ SOLR
500.0000 mg | Freq: Once | INTRAMUSCULAR | Status: AC
  Administered 2020-11-20: 01:00:00 500 mg via INTRAMUSCULAR
  Filled 2020-11-19: qty 10

## 2020-11-19 MED ORDER — DOXYCYCLINE HYCLATE 100 MG PO CAPS: 100.0000 mg | ORAL_CAPSULE | Freq: Two times a day (BID) | ORAL | 0 refills | Status: DC

## 2020-11-19 NOTE — Discharge Instructions (Signed)
You have been evaluated for your abdominal pain.  Your symptoms may be due to cervicitis which is an infection of your cervix.  Please take antibiotic as prescribed.  Take ibuprofen as needed for pain.  Stay hydrated by drinking plenty of fluid.  Please return if you develop fever, worsening abdominal pain, or if you have any other concern.

## 2020-11-19 NOTE — ED Notes (Addendum)
Pt ambulatory to RR w/out assistance to attempt to collect urine sample.

## 2020-11-19 NOTE — ED Notes (Signed)
Attempted to collect urine. Pt unable to void

## 2020-11-19 NOTE — ED Triage Notes (Signed)
Pt arrived via walk in, c/o diffuse abd pain and vomiting. States she had a couple bouts of diarrhea at home, but since resolved. States recent dx with UTI. On abx x2 days, denies any urinary sx at this time.

## 2020-11-19 NOTE — ED Provider Notes (Signed)
[facility]-EMERGENCY DEPT Provider Note   CSN: [id number] Arrival date & time: 11/19/20  1759     History Chief Complaint  Patient presents with  . Abdominal Pain    Kristen Donovan is a 24 y.o. female.  The history is provided by the patient and medical records. No language interpreter was used.  Abdominal Pain    24 year old female presenting for evaluation of abdominal pain. Patient report she developed pain to her lower abdomen that started approximately 5 days ago. She described initial symptoms as burning on urination with increased frequency and urgency. She was seen 3 days ago for her pain and test positive for UTI. She was prescribed antibiotic she believes antibiotic as nitrofurantoin which she takes twice daily. She endorsed for the past 2 days she has been feeling very nauseous, having bouts of vomiting and diarrhea. She also endorsed recurrent pain to the low abdominal region. She denies burning on urination at this time. She does have an appetite but afraid to eat. She denies any new sexual partner, no vaginal bleeding or vaginal discharge. No prior abdominal surgeries. She has not been vaccinated for COVID-19 however denies any cold symptoms.  No past medical history on file.  There are no problems to display for this patient.   No past surgical history on file.   OB History   No obstetric history on file.     No family history on file.  Social History   Tobacco Use  . Smoking status: Never Smoker    Home Medications Prior to Admission medications   Medication Sig Start Date End Date Taking? Authorizing Provider  albuterol (PROVENTIL HFA;VENTOLIN HFA) 108 (90 BASE) MCG/ACT inhaler Inhale 2 puffs into the lungs every 4 (four) hours as needed for wheezing (cough, shortness of breath or wheezing.). Patient not taking: Reported on 06/12/2020 09/27/12   Carmelina Dane, MD  buPROPion (WELLBUTRIN XL) 150 MG 24 hr tablet Take 150 mg by mouth  daily.    [provider]  cimetidine (TAGAMET) 400 MG tablet Take 2 tablets (800 mg total) by mouth at bedtime. Patient not taking: Reported on 06/12/2020 09/27/12   Carmelina Dane, MD  cyproheptadine (PERIACTIN) 4 MG tablet Take 1 tablet (4 mg total) by mouth 4 (four) times daily. Patient not taking: Reported on 06/12/2020 09/27/12   Carmelina Dane, MD  ondansetron (ZOFRAN ODT) 4 MG disintegrating tablet Take 1 tablet (4 mg total) by mouth every 8 (eight) hours as needed for nausea or vomiting. 08/25/20   Henderly, Britni A, PA-C  phenazopyridine (PYRIDIUM) 95 MG tablet Take 1 tablet (95 mg total) by mouth 3 (three) times daily as needed for pain. 08/25/20   Henderly, Britni A, PA-C  Spacer/Aero-Holding Rudean Curt Use with MDI as directed Patient not taking: Reported on 06/12/2020 09/27/12   Carmelina Dane, MD    Allergies    Patient has no known allergies.  Review of Systems   Review of Systems  Gastrointestinal: Positive for abdominal pain.  All other systems reviewed and are negative.   Physical Exam Updated Vital Signs BP 129/73 (BP Location: Left Arm)   Pulse 83   Temp 98.5 F (36.9 C) (Oral)   Resp 16   SpO2 100%   Physical Exam Vitals and nursing note reviewed.  Constitutional:      General: She is not in acute distress.    Appearance: She is well-developed and well-nourished.  HENT:     Head: Atraumatic.  Eyes:  Conjunctiva/sclera: Conjunctivae normal.  Cardiovascular:     Rate and Rhythm: Normal rate and regular rhythm.  Pulmonary:     Effort: Pulmonary effort is normal.     Breath sounds: Normal breath sounds.  Abdominal:     General: Abdomen is flat.     Tenderness: There is abdominal tenderness in the right lower quadrant and suprapubic area. There is no right CVA tenderness, left CVA tenderness or guarding. Negative signs include Murphy's sign, Rovsing's sign and McBurney's sign.  Musculoskeletal:     Cervical back: Neck supple.   Skin:    Findings: No rash.  Neurological:     Mental Status: She is alert.  Psychiatric:        Mood and Affect: Mood and affect normal.     ED Results / Procedures / Treatments   Labs (all labs ordered are listed, but only abnormal results are displayed) Labs Reviewed  WET PREP, GENITAL - Abnormal; Notable for the following components:      Result Value   WBC, Wet Prep HPF POC MANY (*)    All other components within normal limits  COMPREHENSIVE METABOLIC PANEL - Abnormal; Notable for the following components:   CO2 21 (*)    All other components within normal limits  URINALYSIS, ROUTINE W REFLEX MICROSCOPIC - Abnormal; Notable for the following components:   Color, Urine AMBER (*)    APPearance CLOUDY (*)    Specific Gravity, Urine 1.032 (*)    Hgb urine dipstick SMALL (*)    Ketones, ur 80 (*)    Protein, ur 100 (*)    Leukocytes,Ua TRACE (*)    Bacteria, UA MANY (*)    All other components within normal limits  LIPASE, BLOOD  CBC  RPR  HIV ANTIBODY (ROUTINE TESTING W REFLEX)  I-STAT BETA HCG BLOOD, ED ([facility], [facility], [facility] ONLY)  GC/CHLAMYDIA PROBE AMP ([place]) NOT AT [facility]    EKG None  Radiology No results found.  Procedures Pelvic exam  Date/Time: 11/19/2020 9:17 PM Performed by: Fayrene Helper, PA-C Authorized by: Fayrene Helper, PA-C  Consent: Verbal consent obtained. Risks and benefits: risks, benefits and alternatives were discussed Consent given by: patient Patient identity confirmed: verbally with patient Local anesthesia used: no  Anesthesia: Local anesthesia used: no  Sedation: Patient sedated: no  Patient tolerance: patient tolerated the procedure well with no immediate complications Comments: Shanda Bumps, RN served as Biomedical engineer.  No inguinal lymphadenopathy or inguinal hernia noted.  Normal external genitalia.  Mild discomfort with speculum insertion.  Moderate amount of discharge noted in vaginal vault.  Close cervical os free of lesion or rash.  On  bimanual examination, right adnexal tenderness without cervical motion tenderness.    (including critical care time)  Medications Ordered in ED Medications  sodium chloride 0.9 % bolus 1,000 mL (0 mLs Intravenous Stopped 11/19/20 2253)    ED Course  I have reviewed the triage vital signs and the nursing notes.  Pertinent labs & imaging results that were available during my care of the patient were reviewed by me and considered in my medical decision making (see chart for details).    MDM Rules/Calculators/A&P                          BP 118/71   Pulse 72   Temp 98.5 F (36.9 C) (Oral)   Resp 16   SpO2 98%   Final Clinical Impression(s) / ED Diagnoses Final diagnoses:  Cervicitis  Dehydration    Rx / DC Orders ED Discharge Orders         Ordered    doxycycline (VIBRAMYCIN) 100 MG capsule  2 times daily        11/19/20 2347    ibuprofen (ADVIL) 600 MG tablet  Every 6 hours PRN        11/19/20 2347         8:55 PM Patient report having low abdominal pain, was diagnosed with UTI 7 days prior, took antibiotic but pain still persisted. On exam she does have some tenderness to the right lower quadrant and suprapubic region. No guarding rebound tenderness. Given her location, we will also perform pelvic exam. IV fluid given  9:18 PM On pelvic exam patient has right adnexal tenderness without cervical motion tenderness.  She also has moderate amount of vaginal discharge noted with odor.  UA obtained shows trace leukocyte esterase, 11-20 WBC but many bacteria.  She is nitrite negative.  I am concerned of potential STI.  11:18 PM Wet prep shows many WBC. Urine shows 80 ketones, 11-20 WBC and many bacteria with trace leukocyte esterase. Patient did receive IV fluid here. Give Rocephin 500 mg IM and discharged home with doxycycline for potential STI. Doubt ovarian torsion or TOA. Doubt appendicitis.  Pt stable for discharge, return precaution given.    Fayrene Helper,  PA-C 11/19/20 2348    Mancel Bale, MD 11/20/20 1001

## 2020-11-20 LAB — GC/CHLAMYDIA PROBE AMP (~~LOC~~) NOT AT ARMC
Chlamydia: NEGATIVE
Comment: NEGATIVE
Comment: NORMAL
Neisseria Gonorrhea: NEGATIVE

## 2020-11-20 LAB — RPR: RPR Ser Ql: NONREACTIVE

## 2020-11-20 LAB — HIV ANTIBODY (ROUTINE TESTING W REFLEX): HIV Screen 4th Generation wRfx: NONREACTIVE

## 2020-11-20 MED ORDER — STERILE WATER FOR INJECTION IJ SOLN
INTRAMUSCULAR | Status: AC
  Filled 2020-11-20: qty 10

## 2020-11-25 ENCOUNTER — Other Ambulatory Visit: Payer: Self-pay

## 2020-11-25 ENCOUNTER — Encounter (HOSPITAL_COMMUNITY): Payer: Self-pay | Admitting: Emergency Medicine

## 2020-11-25 ENCOUNTER — Emergency Department (HOSPITAL_COMMUNITY): Payer: Commercial Managed Care - PPO

## 2020-11-25 ENCOUNTER — Emergency Department (HOSPITAL_COMMUNITY)
Admission: EM | Admit: 2020-11-25 | Discharge: 2020-11-26 | Disposition: A | Discharge status: Home / Self Care | Payer: Commercial Managed Care - PPO | Attending: Emergency Medicine | Admitting: Emergency Medicine

## 2020-11-25 DIAGNOSIS — R112 Nausea with vomiting, unspecified: Secondary | ICD-10-CM | POA: Insufficient documentation

## 2020-11-25 DIAGNOSIS — F17210 Nicotine dependence, cigarettes, uncomplicated: Secondary | ICD-10-CM | POA: Diagnosis not present

## 2020-11-25 DIAGNOSIS — R1031 Right lower quadrant pain: Secondary | ICD-10-CM | POA: Insufficient documentation

## 2020-11-25 DIAGNOSIS — R350 Frequency of micturition: Secondary | ICD-10-CM | POA: Insufficient documentation

## 2020-11-25 DIAGNOSIS — R300 Dysuria: Secondary | ICD-10-CM | POA: Insufficient documentation

## 2020-11-25 DIAGNOSIS — R109 Unspecified abdominal pain: Secondary | ICD-10-CM | POA: Diagnosis present

## 2020-11-25 LAB — COMPREHENSIVE METABOLIC PANEL
ALT: 16 U/L (ref 0–44)
AST: 18 U/L (ref 15–41)
Albumin: 4.3 g/dL (ref 3.5–5.0)
Alkaline Phosphatase: 47 U/L (ref 38–126)
Anion gap: 12 (ref 5–15)
BUN: 10 mg/dL (ref 6–20)
CO2: 20 mmol/L — ABNORMAL LOW (ref 22–32)
Calcium: 9.3 mg/dL (ref 8.9–10.3)
Chloride: 108 mmol/L (ref 98–111)
Creatinine, Ser: 0.6 mg/dL (ref 0.44–1.00)
GFR, Estimated: >60 mL/min (ref >60)
Glucose, Bld: 94 mg/dL (ref 70–99)
Potassium: 3.8 mmol/L (ref 3.5–5.1)
Sodium: 140 mmol/L (ref 135–145)
Total Bilirubin: 0.4 mg/dL (ref 0.3–1.2)
Total Protein: 7.6 g/dL (ref 6.5–8.1)

## 2020-11-25 LAB — URINALYSIS, ROUTINE W REFLEX MICROSCOPIC
Bacteria, UA: NONE SEEN
Bilirubin Urine: NEGATIVE
Glucose, UA: NEGATIVE mg/dL
Hgb urine dipstick: NEGATIVE
Ketones, ur: NEGATIVE mg/dL
Nitrite: NEGATIVE
Protein, ur: NEGATIVE mg/dL
Specific Gravity, Urine: 1.018 (ref 1.005–1.030)
pH: 6 (ref 5.0–8.0)

## 2020-11-25 LAB — CBC
HCT: 38.9 % (ref 36.0–46.0)
Hemoglobin: 12.9 g/dL (ref 12.0–15.0)
MCH: 31 pg (ref 26.0–34.0)
MCHC: 33.2 g/dL (ref 30.0–36.0)
MCV: 93.5 fL (ref 80.0–100.0)
Platelets: 318 10*3/uL (ref 150–400)
RBC: 4.16 MIL/uL (ref 3.87–5.11)
RDW: 12 % (ref 11.5–15.5)
WBC: 7.3 10*3/uL (ref 4.0–10.5)
nRBC: 0 % (ref 0.0–0.2)

## 2020-11-25 LAB — I-STAT BETA HCG BLOOD, ED (MC, WL, AP ONLY): I-stat hCG, quantitative: <5 m[IU]/mL (ref <5)

## 2020-11-25 LAB — LIPASE, BLOOD: Lipase: 31 U/L (ref 11–51)

## 2020-11-25 MED ORDER — METOCLOPRAMIDE HCL 5 MG/ML IJ SOLN
10.0000 mg | Freq: Once | INTRAMUSCULAR | Status: AC
  Administered 2020-11-25: 23:00:00 10 mg via INTRAVENOUS
  Filled 2020-11-25: qty 2

## 2020-11-25 MED ORDER — IOHEXOL 300 MG/ML  SOLN
100.0000 mL | Freq: Once | INTRAMUSCULAR | Status: AC | PRN
  Administered 2020-11-25: 23:00:00 100 mL via INTRAVENOUS

## 2020-11-25 MED ORDER — DICYCLOMINE HCL 20 MG PO TABS
20.0000 mg | ORAL_TABLET | Freq: Once | ORAL | Status: AC
  Administered 2020-11-25: 23:00:00 20 mg via ORAL
  Filled 2020-11-25: qty 1

## 2020-11-25 NOTE — ED Triage Notes (Signed)
Patient endorses abdominal pain x2 weeks, had a previous UTI that has not cleared up, reports multiple episodes of emesis. Burning with urination, tried 6 days of doxycyline with no improvement, just got switched to Bactrim today, has not started these abx yet. Abdominal pain is in LLQ that wraps around to her L flank.

## 2020-11-25 NOTE — ED Provider Notes (Signed)
[facility]-EMERGENCY DEPT Provider Note   CSN: [id number] Arrival date & time: 11/25/20  1756     History No chief complaint on file.   Kristen Donovan is a 24 y.o. female with a history of intentional overdose who presents emergency department with a chief complaint of abdominal pain.  The patient reports that 2 weeks ago that she developed right lower quadrant abdominal pain, dysuria, urinary urgency, and vomiting.  She is unable to characterize the pain, but states that it radiated into her left lower quadrant.  She was seen at urgent care and and urine was concerning for UTI so she was started on nitrofurantoin.  Symptoms did not improve so she presented to the ER on 12/16 and underwent a comprehensive work-up, including a pelvic exam and STI testing, which was all reassuring.  She was treated with Rocephin in the ER and discharged with doxycycline.  Symptoms persisted so she was seen again and was given a prescription of Bactrim, which she started today.  However, she reports that when she went to pick up the prescription of Bactrim at the pharmacy but the pain suddenly worsened and she doubled over in pain and then had a syncopal episode for a few seconds.  She denies hitting her head.  Since her last visit, she now reports that she has developed right-sided flank pain.  She also reports that she has persistently had vomiting over the last few weeks that has been well controlled with Zofran until today when she had a 5 episodes of nonbloody, nonbilious vomiting.  She reports associated chills and has been feeling feverish, but has not checked her temperature.  She does report that she has been having vaginal discharge that was green for 1 day, but is otherwise been clear.  She has not been sexually active since 12/8.  She has not had any new sexual partners as such she was seen in the ER on 12/16.  She has no concerns for STIs at this time.  No history of abdominal  surgery.  She reports a history of 2 prior UTIs prior to her current infection.  She reports a history of previous syncopal episodes.   The history is provided by the patient and medical records. No language interpreter was used.       History reviewed. No pertinent past medical history.  There are no problems to display for this patient.   History reviewed. No pertinent surgical history.   OB History   No obstetric history on file.     No family history on file.  Social History   Tobacco Use  . Smoking status: Current Every Day Smoker    Types: Cigarettes  . Smokeless tobacco: Never Used  . Tobacco comment: 3-4 per day  Substance Use Topics  . Alcohol use: Yes    Comment: socially  . Drug use: Not Currently    Home Medications Prior to Admission medications   Medication Sig Start Date End Date Taking? Authorizing Provider  buPROPion (WELLBUTRIN XL) 150 MG 24 hr tablet Take 150 mg by mouth daily.   Yes [provider]  ibuprofen (ADVIL) 600 MG tablet Take 1 tablet (600 mg total) by mouth every 6 (six) hours as needed. 11/19/20  Yes Fayrene Helper, PA-C  Multiple Vitamin (MULTIVITAMIN WITH MINERALS) TABS tablet Take 1 tablet by mouth daily.   Yes [provider]  Norethindrone Acetate-Ethinyl Estradiol (LOESTRIN) 1.5-30 MG-MCG tablet Take 1 tablet by mouth daily. 09/14/20  Yes [provider]  dicyclomine (BENTYL) 20 MG tablet Take 1 tablet (20 mg total) by mouth 2 (two) times daily. 11/26/20   Elizabethann Lackey A, PA-C  metoCLOPramide (REGLAN) 10 MG tablet Take 1 tablet (10 mg total) by mouth every 6 (six) hours. 11/26/20   Danille Oppedisano, Pedro Earls A, PA-C  Spacer/Aero-Holding Rudean Curt Use with MDI as directed Patient not taking: Reported on 06/12/2020 09/27/12   Carmelina Dane, MD  albuterol (PROVENTIL HFA;VENTOLIN HFA) 108 (90 BASE) MCG/ACT inhaler Inhale 2 puffs into the lungs every 4 (four) hours as needed for wheezing (cough, shortness of breath or  wheezing.). 09/27/12 11/19/20  Carmelina Dane, MD  cimetidine (TAGAMET) 400 MG tablet Take 2 tablets (800 mg total) by mouth at bedtime. 09/27/12 11/19/20  Carmelina Dane, MD  cyproheptadine (PERIACTIN) 4 MG tablet Take 1 tablet (4 mg total) by mouth 4 (four) times daily. 09/27/12 11/19/20  Carmelina Dane, MD    Allergies    Patient has no known allergies.  Review of Systems   Review of Systems  Constitutional: Negative for activity change, chills and fever.  HENT: Negative for congestion and sore throat.   Eyes: Negative for visual disturbance.  Respiratory: Negative for shortness of breath.   Cardiovascular: Negative for chest pain.  Gastrointestinal: Positive for abdominal pain, nausea and vomiting. Negative for anal bleeding, blood in stool and diarrhea.  Genitourinary: Positive for dysuria, flank pain and vaginal discharge. Negative for frequency, genital sores, menstrual problem, urgency, vaginal bleeding and vaginal pain.       Urgency  Musculoskeletal: Negative for back pain.  Skin: Negative for rash.  Allergic/Immunologic: Negative for immunocompromised state.  Neurological: Positive for syncope. Negative for dizziness, seizures, weakness, numbness and headaches.  Psychiatric/Behavioral: Negative for confusion.    Physical Exam Updated Vital Signs BP 102/67   Pulse (!) 45   Temp 98.1 F (36.7 C) (Oral)   Resp 18   Ht 5\' 5"  (1.651 m)   Wt 68 kg   SpO2 (!) 87%   BMI 24.96 kg/m   Physical Exam Vitals and nursing note reviewed.  Constitutional:      General: She is not in acute distress.    Appearance: She is not ill-appearing, toxic-appearing or diaphoretic.  HENT:     Head: Normocephalic.  Eyes:     Conjunctiva/sclera: Conjunctivae normal.  Cardiovascular:     Rate and Rhythm: Normal rate and regular rhythm.     Pulses: Normal pulses.     Heart sounds: Normal heart sounds. No murmur heard. No friction rub. No gallop.   Pulmonary:     Effort:  Pulmonary effort is normal. No respiratory distress.     Breath sounds: No stridor. No wheezing, rhonchi or rales.  Chest:     Chest wall: No tenderness.  Abdominal:     General: There is no distension.     Palpations: Abdomen is soft. There is no mass.     Tenderness: There is no abdominal tenderness. There is no right CVA tenderness, left CVA tenderness, guarding or rebound.     Hernia: No hernia is present.     Comments: Abdomen soft, nontender, nondistended.  No rebound or guarding.  No CVA tenderness bilaterally.  No tenderness over McBurney's point.  Negative Murphy sign.  Musculoskeletal:     Cervical back: Neck supple.  Skin:    General: Skin is warm.     Coloration: Skin is not jaundiced or pale.     Findings: No rash.  Neurological:     Mental Status: She is alert.  Psychiatric:        Mood and Affect: Affect is flat.        Behavior: Behavior normal.     ED Results / Procedures / Treatments   Labs (all labs ordered are listed, but only abnormal results are displayed) Labs Reviewed  COMPREHENSIVE METABOLIC PANEL - Abnormal; Notable for the following components:      Result Value   CO2 20 (*)    All other components within normal limits  URINALYSIS, ROUTINE W REFLEX MICROSCOPIC - Abnormal; Notable for the following components:   APPearance CLOUDY (*)    Leukocytes,Ua MODERATE (*)    All other components within normal limits  URINE CULTURE  LIPASE, BLOOD  CBC  I-STAT BETA HCG BLOOD, ED ([facility], [facility], [facility] ONLY)    EKG None  Radiology CT ABDOMEN PELVIS W CONTRAST  Result Date: 11/25/2020 CLINICAL DATA:  Left lower quadrant abdominal pain x2 weeks. EXAM: CT ABDOMEN AND PELVIS WITH CONTRAST TECHNIQUE: Multidetector CT imaging of the abdomen and pelvis was performed using the standard protocol following bolus administration of intravenous contrast. CONTRAST:  100mL OMNIPAQUE IOHEXOL 300 MG/ML  SOLN COMPARISON:  August 25, 2020 FINDINGS: Lower chest: No acute  abnormality. Hepatobiliary: No focal liver abnormality is seen. No gallstones, gallbladder wall thickening, or biliary dilatation. Pancreas: Unremarkable. No pancreatic ductal dilatation or surrounding inflammatory changes. Spleen: Normal in size without focal abnormality. Adrenals/Urinary Tract: Adrenal glands are unremarkable. Kidneys are normal, without renal calculi, focal lesion, or hydronephrosis. Bladder is unremarkable. Stomach/Bowel: Stomach is within normal limits. Appendix appears normal. No evidence of bowel wall thickening, distention, or inflammatory changes. Vascular/Lymphatic: No significant vascular findings are present. No enlarged abdominal or pelvic lymph nodes. Reproductive: Uterus and bilateral adnexa are unremarkable. Other: No abdominal wall hernia or abnormality. No abdominopelvic ascites. Musculoskeletal: No acute or significant osseous findings. IMPRESSION: No CT evidence of acute intra-abdominal pathology. Electronically Signed   By: Aram Candelahaddeus  Houston M.D.   On: 11/25/2020 23:24    Procedures Procedures (including critical care time)  Medications Ordered in ED Medications  metoCLOPramide (REGLAN) injection 10 mg (10 mg Intravenous Given 11/25/20 2303)  dicyclomine (BENTYL) tablet 20 mg (20 mg Oral Given 11/25/20 2303)  iohexol (OMNIPAQUE) 300 MG/ML solution 100 mL (100 mLs Intravenous Contrast Given 11/25/20 2315)    ED Course  I have reviewed the triage vital signs and the nursing notes.  Pertinent labs & imaging results that were available during my care of the patient were reviewed by me and considered in my medical decision making (see chart for details).    MDM Rules/Calculators/A&P                          24 year old female with history of intentional overdose who presents the emergency department with 2 weeks of right lower quadrant abdominal pain and dysuria.  She was treated for a UTI with nitrofurantoin, doxycycline, and started a course of Bactrim today.   Unfortunately, I do not have any urine cultures available in her medical record from previous visits.  Vital signs are stable.  Abdomen is benign.  Labs and imaging have been independently reviewed and interpreted by me.  No leukocytosis.  No metabolic derangements.  Pregnancy test is negative.  UA with leukocyte esterase, but otherwise unremarkable.  Urine culture is pending.  Patient's medical record was reviewed.  She had STI testing last week, which was all  negative.  Given her presentation, differential diagnosis could include a ruptured appendicitis that has been partially treated by multiple courses of antibiotics that she has been taking over the last 2 weeks.  Could also consider a TOA, obstructive uropathy, pyelonephritis, mesenteric ischemia, diverticulitis.  Less likely ovarian torsion.  Given concern for partially treated appendicitis, I think it is appropriate to perform a CT scan of the patient's abdomen and pelvis today.  CT scan was obtained and is unremarkable.  On reevaluation, patient reports that she feels much improved after Bentyl and Reglan.  She is tolerating fluids at bedside without difficulty.  Regarding her syncopal episode today, she has a history of similar and I suspect this was likely vasovagal in nature.  She has had no further dizziness or lightheadedness or presyncopal symptoms.  Given her reassuring work-up, I think that is reasonable to have her follow-up with primary care.  She is aware that urine culture is pending.  ER return precautions have been given.  She is hemodynamically stable no acute distress.  Safe for discharge home with outpatient follow-up as indicated.    Final Clinical Impression(s) / ED Diagnoses Final diagnoses:  Right lower quadrant abdominal pain  Non-intractable vomiting with nausea, unspecified vomiting type    Rx / DC Orders ED Discharge Orders         Ordered    dicyclomine (BENTYL) 20 MG tablet  2 times daily         11/26/20 0031    metoCLOPramide (REGLAN) 10 MG tablet  Every 6 hours        11/26/20 0031           Jamerica Snavely, Pedro Earls A, PA-C 11/26/20 0039    Horton, Clabe Seal, DO 11/26/20 1603

## 2020-11-26 MED ORDER — METOCLOPRAMIDE HCL 10 MG PO TABS: 10.0000 mg | ORAL_TABLET | Freq: Four times a day (QID) | ORAL | 0 refills | Status: AC

## 2020-11-26 MED ORDER — DICYCLOMINE HCL 20 MG PO TABS: 20.0000 mg | ORAL_TABLET | Freq: Two times a day (BID) | ORAL | 0 refills | Status: AC

## 2020-11-26 NOTE — Discharge Instructions (Addendum)
Thank you for allowing me to care for you today in the Emergency Department.   As we discussed, your work-up today was reassuring.  I did send your urine for culture, but that it may take a couple of days to get this result.  You can continue to take the prescription of Bactrim that you just started today until the urine culture results.  Please call to schedule a follow-up appointment with your primary care provider regarding your ER visit today.  You can take 1 tablet of Reglan every 6 hours as needed for nausea or vomiting.   You can take 1 tablet of Bentyl as directed for abdominal pain.  Return the emergency department if you start making urine, if you develop severe, uncontrollable abdominal pain, if you have uncontrollable vomiting, or if you develop other new, concerning symptoms.

## 2020-11-28 LAB — URINE CULTURE: Culture: 20000 — AB

## 2020-11-29 ENCOUNTER — Telehealth (HOSPITAL_BASED_OUTPATIENT_CLINIC_OR_DEPARTMENT_OTHER): Payer: Self-pay | Admitting: Emergency Medicine

## 2020-11-29 NOTE — Telephone Encounter (Signed)
Post ED Visit - Positive Culture Follow-up  Culture report reviewed by antimicrobial stewardship pharmacist: [facility] []  , Pharm.D. []  Enzo Bi, Pharm.D., BCPS AQ-ID []  , Pharm.D., BCPS []  Celedonio Miyamoto, .D., BCPS []  [place], .D., BCPS, AAHIVP []  Georgina Pillion, Pharm.D., BCPS, AAHIVP []  [address], PharmD, BCPS []  , PharmD, BCPS []  [place], PharmD, BCPS []  [address], PharmD []  , PharmD, BCPS []  Estella Husk, PharmD  Pharmacy Team []  Lysle Pearl, PharmD [x]  , PharmD []  Phillips Climes, PharmD []  , Rph []  Agapito Games) , PharmD []  Verlan Friends, PharmD []  , PharmD []  Mervyn Gay, PharmD []  , PharmD []  Vinnie Level, PharmD []  [facility], PharmD []  , PharmD []  Len Childs, PharmD   Positive urine culture No further patient follow-up is required at this time. PA  Yvetta Coder C Adriona Kaney 11/29/2020, 6:01 PM

## 2020-11-29 NOTE — Progress Notes (Signed)
ED Antimicrobial Stewardship Positive Culture Follow Up   Kristen Donovan is an 24 y.o. female who presented to [facility] on 11/25/2020 with a chief complaint of abdominal pain.   Recent Results (from the past 720 hour(s))  Wet prep, genital     Status: Abnormal   Collection Time: 11/19/20  9:16 PM   Specimen: PATH Cytology Cervicovaginal Ancillary Only  Result Value Ref Range Status   Yeast Wet Prep HPF POC NONE SEEN NONE SEEN Final   Trich, Wet Prep NONE SEEN NONE SEEN Final   Clue Cells Wet Prep HPF POC NONE SEEN NONE SEEN Final   WBC, Wet Prep HPF POC MANY (A) NONE SEEN Final   Sperm NONE SEEN  Final    Comment: Performed at [facility], [address] [place], [place] [address]  Urine culture     Status: Abnormal   Collection Time: 11/25/20 10:57 PM   Specimen: Urine, Clean Catch  Result Value Ref Range Status   Specimen Description   Final    URINE, CLEAN CATCH Performed at [facility], [address] [place], [place] [address]    Special Requests   Final    NONE Performed at [facility], [address], [place], [place] [address]    Culture 20,000 COLONIES/mL STAPHYLOCOCCUS EPIDERMIDIS (A)  Final   Report Status 11/28/2020 FINAL  Final   Organism ID, Bacteria STAPHYLOCOCCUS EPIDERMIDIS (A)  Final      Susceptibility   Staphylococcus epidermidis - MIC*    CIPROFLOXACIN <=0.5 SENSITIVE Sensitive     GENTAMICIN <=0.5 SENSITIVE Sensitive     NITROFURANTOIN <=16 SENSITIVE Sensitive     OXACILLIN >=4 RESISTANT Resistant     TETRACYCLINE >=16 RESISTANT Resistant     VANCOMYCIN 2 SENSITIVE Sensitive     TRIMETH/SULFA >=320 RESISTANT Resistant     CLINDAMYCIN >=8 RESISTANT Resistant     RIFAMPIN <=0.5 SENSITIVE Sensitive     Inducible Clindamycin NEGATIVE Sensitive     * 20,000 COLONIES/mL STAPHYLOCOCCUS EPIDERMIDIS    Plan:  Per discussion with ED provider, no further antibiotics indicated.  ED Provider: Fayrene Helper, PA-C   Greer Pickerel M 11/29/2020, 3:19 PM Clinical Pharmacist [phone]

## 2021-03-24 ENCOUNTER — Emergency Department (HOSPITAL_COMMUNITY): Payer: Commercial Managed Care - PPO

## 2021-03-24 ENCOUNTER — Encounter (HOSPITAL_COMMUNITY): Payer: Self-pay | Admitting: Emergency Medicine

## 2021-03-24 ENCOUNTER — Emergency Department (HOSPITAL_COMMUNITY)
Admission: EM | Admit: 2021-03-24 | Discharge: 2021-03-24 | Disposition: A | Discharge status: Home / Self Care | Payer: Commercial Managed Care - PPO | Attending: Emergency Medicine | Admitting: Emergency Medicine

## 2021-03-24 ENCOUNTER — Other Ambulatory Visit: Payer: Self-pay

## 2021-03-24 DIAGNOSIS — R55 Syncope and collapse: Secondary | ICD-10-CM | POA: Insufficient documentation

## 2021-03-24 DIAGNOSIS — R42 Dizziness and giddiness: Secondary | ICD-10-CM | POA: Insufficient documentation

## 2021-03-24 DIAGNOSIS — R000 Tachycardia, unspecified: Secondary | ICD-10-CM | POA: Diagnosis not present

## 2021-03-24 DIAGNOSIS — R0789 Other chest pain: Secondary | ICD-10-CM | POA: Diagnosis not present

## 2021-03-24 DIAGNOSIS — R079 Chest pain, unspecified: Secondary | ICD-10-CM

## 2021-03-24 DIAGNOSIS — F17210 Nicotine dependence, cigarettes, uncomplicated: Secondary | ICD-10-CM | POA: Insufficient documentation

## 2021-03-24 LAB — CBC WITH DIFFERENTIAL/PLATELET
Abs Immature Granulocytes: 0.02 10*3/uL (ref 0.00–0.07)
Basophils Absolute: 0 10*3/uL (ref 0.0–0.1)
Basophils Relative: 1 %
Eosinophils Absolute: 0.1 10*3/uL (ref 0.0–0.5)
Eosinophils Relative: 2 %
HCT: 40.5 % (ref 36.0–46.0)
Hemoglobin: 13.6 g/dL (ref 12.0–15.0)
Immature Granulocytes: 0 %
Lymphocytes Relative: 29 %
Lymphs Abs: 2 10*3/uL (ref 0.7–4.0)
MCH: 31 pg (ref 26.0–34.0)
MCHC: 33.6 g/dL (ref 30.0–36.0)
MCV: 92.3 fL (ref 80.0–100.0)
Monocytes Absolute: 0.6 10*3/uL (ref 0.1–1.0)
Monocytes Relative: 9 %
Neutro Abs: 4 10*3/uL (ref 1.7–7.7)
Neutrophils Relative %: 59 %
Platelets: 314 10*3/uL (ref 150–400)
RBC: 4.39 MIL/uL (ref 3.87–5.11)
RDW: 11.7 % (ref 11.5–15.5)
WBC: 6.8 10*3/uL (ref 4.0–10.5)
nRBC: 0 % (ref 0.0–0.2)

## 2021-03-24 LAB — URINALYSIS, ROUTINE W REFLEX MICROSCOPIC
Bilirubin Urine: NEGATIVE
Glucose, UA: NEGATIVE mg/dL
Ketones, ur: NEGATIVE mg/dL
Nitrite: NEGATIVE
Protein, ur: NEGATIVE mg/dL
Specific Gravity, Urine: 1.006 (ref 1.005–1.030)
pH: 6 (ref 5.0–8.0)

## 2021-03-24 LAB — PREGNANCY, URINE: Preg Test, Ur: NEGATIVE

## 2021-03-24 LAB — D-DIMER, QUANTITATIVE: D-Dimer, Quant: <0.27 ug/mL-FEU (ref 0.00–0.50)

## 2021-03-24 LAB — BASIC METABOLIC PANEL
Anion gap: 9 (ref 5–15)
BUN: 15 mg/dL (ref 6–20)
CO2: 23 mmol/L (ref 22–32)
Calcium: 9.7 mg/dL (ref 8.9–10.3)
Chloride: 107 mmol/L (ref 98–111)
Creatinine, Ser: 0.81 mg/dL (ref 0.44–1.00)
GFR, Estimated: >60 mL/min (ref >60)
Glucose, Bld: 88 mg/dL (ref 70–99)
Potassium: 3.6 mmol/L (ref 3.5–5.1)
Sodium: 139 mmol/L (ref 135–145)

## 2021-03-24 LAB — TROPONIN I (HIGH SENSITIVITY): Troponin I (High Sensitivity): 2 ng/L (ref <18)

## 2021-03-24 MED ORDER — SODIUM CHLORIDE 0.9 % IV BOLUS
1000.0000 mL | Freq: Once | INTRAVENOUS | Status: AC
Start: 2021-03-24 — End: 2021-03-24
  Administered 2021-03-24: 1000 mL via INTRAVENOUS

## 2021-03-24 NOTE — ED Triage Notes (Signed)
Complains of left chest pain that radiates to her L arm since last night, endorses SOB as well. Patient went into work this morning and 'blacked out at her desk,' her vision went black and she felt like she was going to pass out.

## 2021-03-24 NOTE — Discharge Instructions (Addendum)
Call your primary care doctor or specialist as discussed in the next 2-3 days.   Return immediately back to the ER if:  Your symptoms worsen within the next 12-24 hours. You develop new symptoms such as new fevers, persistent vomiting, new pain, shortness of breath, or new weakness or numbness, or if you have any other concerns.  

## 2021-03-24 NOTE — ED Provider Notes (Signed)
[facility]-EMERGENCY DEPT Provider Note   CSN: [id number] Arrival date & time: 03/24/21  3976     History No chief complaint on file.   Kristen Donovan is a 25 y.o. female.  Patient complaining of left-sided chest pain that started last night prior to bedtime.  She states that it was there before she went to sleep and when she woke up she still had left-sided chest discomfort.  Describes it as an aching sensation rating down the left arm.  She is never had prior cardiac history.  Nothing seems to make it better or worse it just has been persistent.  Denies fevers vomiting Or diarrhea.  She states she felt lightheaded had vision darkening as if she was going to pass out but never had a syncopal episode.  Denies fevers cough vomiting or diarrhea.        History reviewed. No pertinent past medical history.  There are no problems to display for this patient.   History reviewed. No pertinent surgical history.   OB History   No obstetric history on file.     No family history on file.  Social History   Tobacco Use  . Smoking status: Current Every Day Smoker    Types: Cigarettes  . Smokeless tobacco: Never Used  . Tobacco comment: 3-4 per day  Substance Use Topics  . Alcohol use: Yes    Comment: socially  . Drug use: Not Currently    Home Medications Prior to Admission medications   Medication Sig Start Date End Date Taking? Authorizing Provider  buPROPion (WELLBUTRIN XL) 150 MG 24 hr tablet Take 150 mg by mouth daily.    [provider]  dicyclomine (BENTYL) 20 MG tablet Take 1 tablet (20 mg total) by mouth 2 (two) times daily. 11/26/20   McDonald, Mia A, PA-C  ibuprofen (ADVIL) 600 MG tablet Take 1 tablet (600 mg total) by mouth every 6 (six) hours as needed. 11/19/20   Fayrene Helper, PA-C  metoCLOPramide (REGLAN) 10 MG tablet Take 1 tablet (10 mg total) by mouth every 6 (six) hours. 11/26/20   McDonald, Mia A, PA-C  Multiple Vitamin  (MULTIVITAMIN WITH MINERALS) TABS tablet Take 1 tablet by mouth daily.    [provider]  Norethindrone Acetate-Ethinyl Estradiol (LOESTRIN) 1.5-30 MG-MCG tablet Take 1 tablet by mouth daily. 09/14/20   [provider]  Spacer/Aero-Holding Rudean Curt Use with MDI as directed Patient not taking: Reported on 06/12/2020 09/27/12   Carmelina Dane, MD  albuterol (PROVENTIL HFA;VENTOLIN HFA) 108 (90 BASE) MCG/ACT inhaler Inhale 2 puffs into the lungs every 4 (four) hours as needed for wheezing (cough, shortness of breath or wheezing.). 09/27/12 11/19/20  Carmelina Dane, MD  cimetidine (TAGAMET) 400 MG tablet Take 2 tablets (800 mg total) by mouth at bedtime. 09/27/12 11/19/20  Carmelina Dane, MD  cyproheptadine (PERIACTIN) 4 MG tablet Take 1 tablet (4 mg total) by mouth 4 (four) times daily. 09/27/12 11/19/20  Carmelina Dane, MD    Allergies    Patient has no known allergies.  Review of Systems   Review of Systems  Constitutional: Negative for fever.  HENT: Negative for ear pain.   Eyes: Negative for pain.  Respiratory: Negative for cough.   Cardiovascular: Positive for chest pain.  Gastrointestinal: Negative for abdominal pain.  Genitourinary: Negative for flank pain.  Musculoskeletal: Negative for back pain.  Skin: Negative for rash.  Neurological: Negative for headaches.    Physical Exam Updated Vital Signs  BP 111/73   Pulse 88   Temp 97.7 F (36.5 C) (Oral)   Resp (!) 24   Ht 5\' 5"  (1.651 m)   Wt 63.5 kg   LMP 03/08/2021   SpO2 100%   BMI 23.30 kg/m   Physical Exam Constitutional:      General: She is not in acute distress.    Appearance: Normal appearance.  HENT:     Head: Normocephalic.     Nose: Nose normal.  Eyes:     Extraocular Movements: Extraocular movements intact.  Cardiovascular:     Rate and Rhythm: Normal rate.  Pulmonary:     Effort: Pulmonary effort is normal.  Musculoskeletal:        General: Normal range of  motion.     Cervical back: Normal range of motion.     Right lower leg: No edema.     Left lower leg: No edema.  Neurological:     General: No focal deficit present.     Mental Status: She is alert. Mental status is at baseline.     ED Results / Procedures / Treatments   Labs (all labs ordered are listed, but only abnormal results are displayed) Labs Reviewed  URINALYSIS, ROUTINE W REFLEX MICROSCOPIC - Abnormal; Notable for the following components:      Result Value   Hgb urine dipstick SMALL (*)    Leukocytes,Ua TRACE (*)    Bacteria, UA MANY (*)    All other components within normal limits  CBC WITH DIFFERENTIAL/PLATELET  BASIC METABOLIC PANEL  D-DIMER, QUANTITATIVE  PREGNANCY, URINE  TROPONIN I (HIGH SENSITIVITY)    EKG None  Radiology DG Chest Port 1 View  Result Date: 03/24/2021 CLINICAL DATA:  Chest pain. EXAM: PORTABLE CHEST 1 VIEW COMPARISON:  No recent. FINDINGS: Mediastinum hilar structures normal. Heart size upper limits normal. No pulmonary venous congestion. Mild left base infiltrate cannot be excluded. No pleural effusion or pneumothorax. Mild thoracic spine scoliosis. IMPRESSION: 1.  Mild left base infiltrate cannot be excluded. 2.  Heart size upper limits normal.  No pulmonary venous congestion. Electronically Signed   By: 03/26/2021  Register   On: 03/24/2021 08:45    Procedures Procedures   Medications Ordered in ED Medications  sodium chloride 0.9 % bolus 1,000 mL (1,000 mLs Intravenous New Bag/Given 03/24/21 03/26/21)    ED Course  I have reviewed the triage vital signs and the nursing notes.  Pertinent labs & imaging results that were available during my care of the patient were reviewed by me and considered in my medical decision making (see chart for details).    MDM Rules/Calculators/A&P                          Patient presents with mild tachycardia.  She has no leg swelling or edema.  However she is a smoker and she takes birth  control.  D-dimer was sent and is unremarkable.  Troponin unremarkable as well.  EKG shows sinus rhythm no ST elevations depressions no T wave inversions noted.  Given her age, history and presentation I doubt acute coronary syndrome.  Will recommend outpatient follow-up with her doctor within the week.  Advised immediate return for worsening symptoms fevers vomiting cough or any additional concerns. Patient specimen sent and discharged home in stable condition. Advised rest and fluid hydration at home.  Final Clinical Impression(s) / ED Diagnoses Final diagnoses:  Chest pain, unspecified type  Near syncope  Rx / DC Orders ED Discharge Orders    None       Cheryll Cockayne, MD 03/24/21 1050

## 2021-11-03 ENCOUNTER — Encounter (HOSPITAL_BASED_OUTPATIENT_CLINIC_OR_DEPARTMENT_OTHER): Payer: Self-pay | Admitting: *Deleted

## 2021-11-03 ENCOUNTER — Emergency Department (HOSPITAL_BASED_OUTPATIENT_CLINIC_OR_DEPARTMENT_OTHER)
Admission: EM | Admit: 2021-11-03 | Discharge: 2021-11-04 | Disposition: A | Discharge status: Home / Self Care | Payer: BC Managed Care – PPO | Attending: Emergency Medicine | Admitting: Emergency Medicine

## 2021-11-03 ENCOUNTER — Other Ambulatory Visit: Payer: Self-pay

## 2021-11-03 DIAGNOSIS — Z79899 Other long term (current) drug therapy: Secondary | ICD-10-CM | POA: Insufficient documentation

## 2021-11-03 DIAGNOSIS — R195 Other fecal abnormalities: Secondary | ICD-10-CM | POA: Diagnosis not present

## 2021-11-03 DIAGNOSIS — F17210 Nicotine dependence, cigarettes, uncomplicated: Secondary | ICD-10-CM | POA: Insufficient documentation

## 2021-11-03 DIAGNOSIS — R1011 Right upper quadrant pain: Secondary | ICD-10-CM | POA: Diagnosis present

## 2021-11-03 DIAGNOSIS — R1031 Right lower quadrant pain: Secondary | ICD-10-CM | POA: Insufficient documentation

## 2021-11-03 HISTORY — DX: Depression, unspecified: F32.A

## 2021-11-03 HISTORY — DX: Anxiety disorder, unspecified: F41.9

## 2021-11-03 LAB — URINALYSIS, ROUTINE W REFLEX MICROSCOPIC
Bilirubin Urine: NEGATIVE
Glucose, UA: NEGATIVE mg/dL
Hgb urine dipstick: NEGATIVE
Ketones, ur: NEGATIVE mg/dL
Nitrite: NEGATIVE
Protein, ur: 30 mg/dL — AB
Specific Gravity, Urine: 1.025 (ref 1.005–1.030)
pH: 7 (ref 5.0–8.0)

## 2021-11-03 LAB — CBC
HCT: 35.3 % — ABNORMAL LOW (ref 36.0–46.0)
Hemoglobin: 12.3 g/dL (ref 12.0–15.0)
MCH: 31.2 pg (ref 26.0–34.0)
MCHC: 34.8 g/dL (ref 30.0–36.0)
MCV: 89.6 fL (ref 80.0–100.0)
Platelets: 312 10*3/uL (ref 150–400)
RBC: 3.94 MIL/uL (ref 3.87–5.11)
RDW: 11.7 % (ref 11.5–15.5)
WBC: 8.6 10*3/uL (ref 4.0–10.5)
nRBC: 0 % (ref 0.0–0.2)

## 2021-11-03 LAB — COMPREHENSIVE METABOLIC PANEL
ALT: 14 U/L (ref 0–44)
AST: 17 U/L (ref 15–41)
Albumin: 4.1 g/dL (ref 3.5–5.0)
Alkaline Phosphatase: 50 U/L (ref 38–126)
Anion gap: 9 (ref 5–15)
BUN: 9 mg/dL (ref 6–20)
CO2: 22 mmol/L (ref 22–32)
Calcium: 9 mg/dL (ref 8.9–10.3)
Chloride: 107 mmol/L (ref 98–111)
Creatinine, Ser: 0.71 mg/dL (ref 0.44–1.00)
GFR, Estimated: >60 mL/min (ref >60)
Glucose, Bld: 125 mg/dL — ABNORMAL HIGH (ref 70–99)
Potassium: 3.5 mmol/L (ref 3.5–5.1)
Sodium: 138 mmol/L (ref 135–145)
Total Bilirubin: 0.1 mg/dL — ABNORMAL LOW (ref 0.3–1.2)
Total Protein: 7.3 g/dL (ref 6.5–8.1)

## 2021-11-03 LAB — URINALYSIS, MICROSCOPIC (REFLEX): RBC / HPF: NONE SEEN RBC/hpf (ref 0–5)

## 2021-11-03 LAB — PREGNANCY, URINE: Preg Test, Ur: NEGATIVE

## 2021-11-03 MED ORDER — SODIUM CHLORIDE 0.9 % IV BOLUS
1000.0000 mL | Freq: Once | INTRAVENOUS | Status: AC
  Administered 2021-11-04: 1000 mL via INTRAVENOUS

## 2021-11-03 MED ORDER — ONDANSETRON HCL 4 MG/2ML IJ SOLN
4.0000 mg | Freq: Once | INTRAMUSCULAR | Status: AC
  Administered 2021-11-04: 4 mg via INTRAVENOUS
  Filled 2021-11-03: qty 2

## 2021-11-03 NOTE — ED Triage Notes (Signed)
C/o diffuse abd pain  x 3 days and rectal bleeding and n/v x 1 day

## 2021-11-03 NOTE — ED Provider Notes (Signed)
[facility] EMERGENCY DEPARTMENT Provider Note   CSN: [id number] Arrival date & time: 11/03/21  2117     History Chief Complaint  Patient presents with   Abdominal Pain    Kristen Donovan is a 25 y.o. female.  Patient is a 25 year old female with history of anxiety/depression.  Patient presenting today for evaluation of abdominal pain.  This has been worsening over the past 2 days.  She describes an episode of bloody stool this afternoon.  Her pain is noted to the right abdomen and worse when she palpates or moves.  She denies any fevers or chills.  She denies any ill contacts or having consumed any suspicious foods.  The history is provided by the patient.  Abdominal Pain Pain location:  RUQ and RLQ Pain quality: cramping   Pain radiates to:  Does not radiate Pain severity:  Moderate Onset quality:  Gradual Duration:  2 days Timing:  Constant Progression:  Worsening Chronicity:  New Relieved by:  Nothing Worsened by:  Movement and palpation     Past Medical History:  Diagnosis Date   Anxiety    Depression     There are no problems to display for this patient.   History reviewed. No pertinent surgical history.   OB History   No obstetric history on file.     No family history on file.  Social History   Tobacco Use   Smoking status: Every Day    Types: Cigarettes   Smokeless tobacco: Never   Tobacco comments:    3-4 per day  Substance Use Topics   Alcohol use: Yes    Comment: socially   Drug use: Not Currently    Home Medications Prior to Admission medications   Medication Sig Start Date End Date Taking? Authorizing Provider  buPROPion (WELLBUTRIN XL) 150 MG 24 hr tablet Take 150 mg by mouth daily.    [provider]  dicyclomine (BENTYL) 20 MG tablet Take 1 tablet (20 mg total) by mouth 2 (two) times daily. 11/26/20   McDonald, Mia A, PA-C  ibuprofen (ADVIL) 600 MG tablet Take 1 tablet (600 mg total) by mouth every 6 (six) hours  as needed. 11/19/20   Fayrene Helper, PA-C  metoCLOPramide (REGLAN) 10 MG tablet Take 1 tablet (10 mg total) by mouth every 6 (six) hours. 11/26/20   McDonald, Mia A, PA-C  Multiple Vitamin (MULTIVITAMIN WITH MINERALS) TABS tablet Take 1 tablet by mouth daily.    [provider]  Norethindrone Acetate-Ethinyl Estradiol (LOESTRIN) 1.5-30 MG-MCG tablet Take 1 tablet by mouth daily. 09/14/20   [provider]  Spacer/Aero-Holding Rudean Curt Use with MDI as directed Patient not taking: Reported on 06/12/2020 09/27/12   Carmelina Dane, MD  albuterol (PROVENTIL HFA;VENTOLIN HFA) 108 (90 BASE) MCG/ACT inhaler Inhale 2 puffs into the lungs every 4 (four) hours as needed for wheezing (cough, shortness of breath or wheezing.). 09/27/12 11/19/20  Carmelina Dane, MD  cimetidine (TAGAMET) 400 MG tablet Take 2 tablets (800 mg total) by mouth at bedtime. 09/27/12 11/19/20  Carmelina Dane, MD  cyproheptadine (PERIACTIN) 4 MG tablet Take 1 tablet (4 mg total) by mouth 4 (four) times daily. 09/27/12 11/19/20  Carmelina Dane, MD    Allergies    Patient has no known allergies.  Review of Systems   Review of Systems  Gastrointestinal:  Positive for abdominal pain.  All other systems reviewed and are negative.  Physical Exam Updated Vital Signs BP 140/67   Pulse  92   Temp 98.5 F (36.9 C) (Oral)   Resp 16   Ht 5\' 6"  (1.676 m)   Wt 63.5 kg   SpO2 100%   BMI 22.60 kg/m   Physical Exam Vitals and nursing note reviewed.  Constitutional:      General: She is not in acute distress.    Appearance: She is well-developed. She is not diaphoretic.  HENT:     Head: Normocephalic and atraumatic.  Cardiovascular:     Rate and Rhythm: Normal rate and regular rhythm.     Heart sounds: No murmur heard.   No friction rub. No gallop.  Pulmonary:     Effort: Pulmonary effort is normal. No respiratory distress.     Breath sounds: Normal breath sounds. No wheezing.  Abdominal:      General: Bowel sounds are normal. There is no distension.     Palpations: Abdomen is soft.     Tenderness: There is abdominal tenderness in the right upper quadrant and right lower quadrant. There is no right CVA tenderness, left CVA tenderness, guarding or rebound.  Musculoskeletal:        General: Normal range of motion.     Cervical back: Normal range of motion and neck supple.  Skin:    General: Skin is warm and dry.  Neurological:     General: No focal deficit present.     Mental Status: She is alert and oriented to person, place, and time.    ED Results / Procedures / Treatments   Labs (all labs ordered are listed, but only abnormal results are displayed) Labs Reviewed  COMPREHENSIVE METABOLIC PANEL - Abnormal; Notable for the following components:      Result Value   Glucose, Bld 125 (*)    Total Bilirubin 0.1 (*)    All other components within normal limits  CBC - Abnormal; Notable for the following components:   HCT 35.3 (*)    All other components within normal limits  URINALYSIS, ROUTINE W REFLEX MICROSCOPIC - Abnormal; Notable for the following components:   Protein, ur 30 (*)    Leukocytes,Ua TRACE (*)    All other components within normal limits  URINALYSIS, MICROSCOPIC (REFLEX) - Abnormal; Notable for the following components:   Bacteria, UA MANY (*)    All other components within normal limits  PREGNANCY, URINE    EKG None  Radiology No results found.  Procedures Procedures   Medications Ordered in ED Medications  sodium chloride 0.9 % bolus 1,000 mL (has no administration in time range)  ondansetron (ZOFRAN) injection 4 mg (has no administration in time range)    ED Course  I have reviewed the triage vital signs and the nursing notes.  Pertinent labs & imaging results that were available during my care of the patient were reviewed by me and considered in my medical decision making (see chart for details).    MDM  Rules/Calculators/A&P  Patient presenting here with complaints of abdominal pain and 1 episode of hematochezia.  Patient's abdominal exam is benign and laboratory studies are reassuring.  She is afebrile with no white count and CT scan fails to show any acute intra-abdominal pathology.  At this point, I feels the patient can be safely discharged.  I am uncertain as to what is causing her bleeding, however the possibility of an internal hemorrhoid or fissure I would say is the most likely etiology.  I will advise her to take stool softeners and will refer to gastroenterology for  possible colonoscopy if symptoms persist.  She understands to return if symptoms worsen or change.  Final Clinical Impression(s) / ED Diagnoses Final diagnoses:  None    Rx / DC Orders ED Discharge Orders     None        Geoffery Lyons, MD 11/04/21 [phone]

## 2021-11-04 ENCOUNTER — Emergency Department (HOSPITAL_BASED_OUTPATIENT_CLINIC_OR_DEPARTMENT_OTHER): Payer: BC Managed Care – PPO

## 2021-11-04 MED ORDER — IOHEXOL 300 MG/ML  SOLN
100.0000 mL | Freq: Once | INTRAMUSCULAR | Status: AC | PRN
  Administered 2021-11-04: 100 mL via INTRAVENOUS

## 2021-11-04 NOTE — Discharge Instructions (Signed)
Take Metamucil, 1 heaping teaspoon in a glass of water twice daily.  Follow-up with gastroenterology if symptoms persist.  The contact information for [facility] has been provided in this discharge summary for you to call and make these arrangements.  Return to the emergency department if you develop worsening pain, high fever, worsening bleeding, lightheadedness/dizziness, or other new and concerning symptoms.

## 2022-09-26 IMAGING — CT CT ABD-PELV W/ CM
2 of 4 series · 16 of 46 positions shown, 18 images · IV contrast (Omnipaque)
Comparison: CT abdomen pelvis dated 11/25/2020.

CLINICAL DATA: Abdominal abscess/infection suspected.

EXAM:
CT ABDOMEN AND PELVIS WITH CONTRAST
TECHNIQUE: Multidetector CT imaging of the abdomen and pelvis was performed
using the standard protocol following bolus administration of
intravenous contrast.
CONTRAST:  100mL OMNIPAQUE IOHEXOL 300 MG/ML  SOLN

[Series 2: axial st · axial · 0.85mm/px · z∈[+518,+918]mm · 13 of 88 slices shown, 15 images]
[im 4/88  soft-tissue]
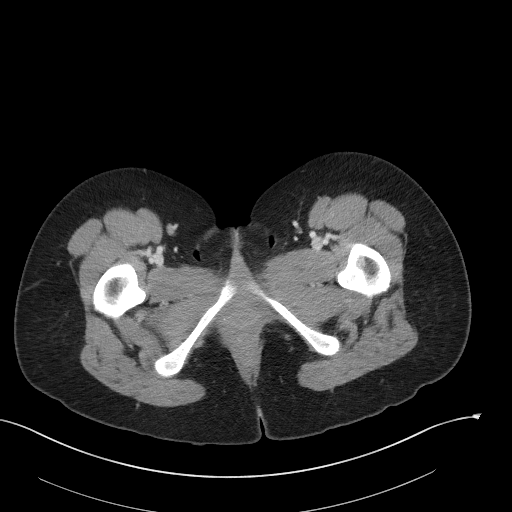
[im 4/88  bone]
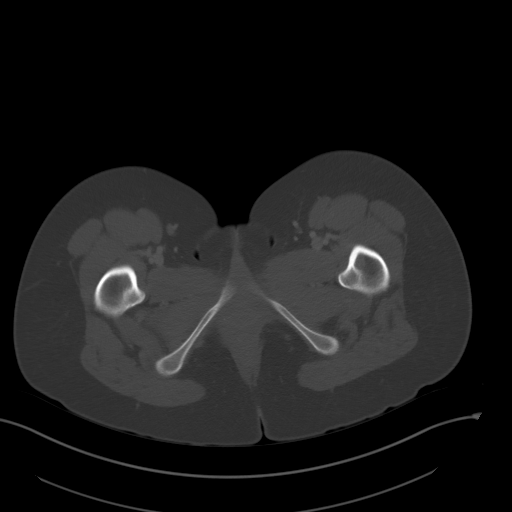
[im 11/88  soft-tissue]
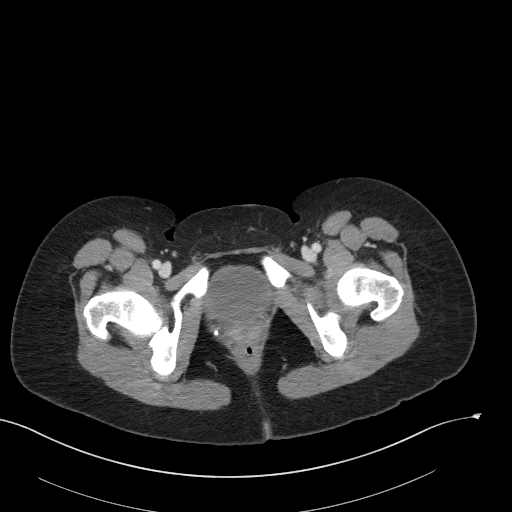
[im 19/88  soft-tissue]
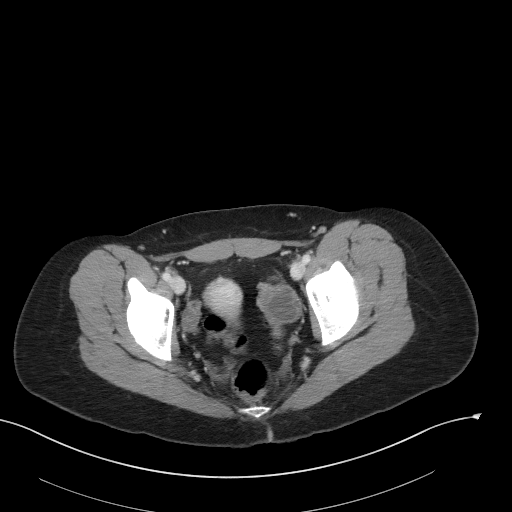
[im 26/88  soft-tissue]
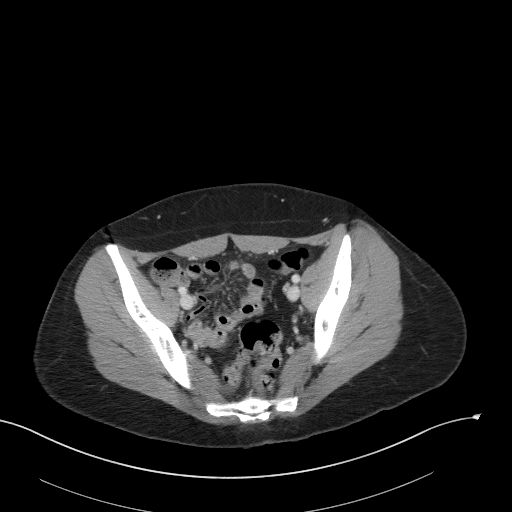
[im 30/88  soft-tissue]
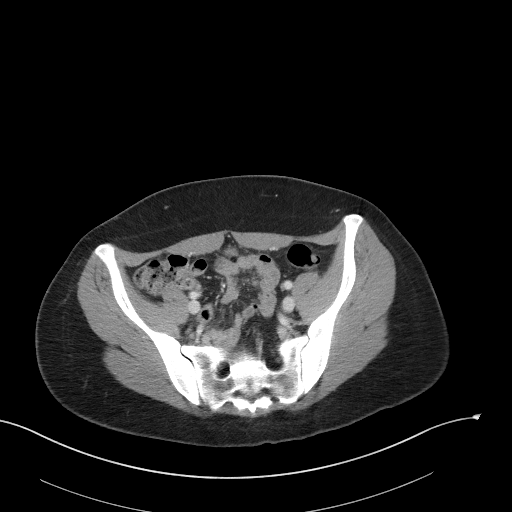
[im 37/88  soft-tissue]
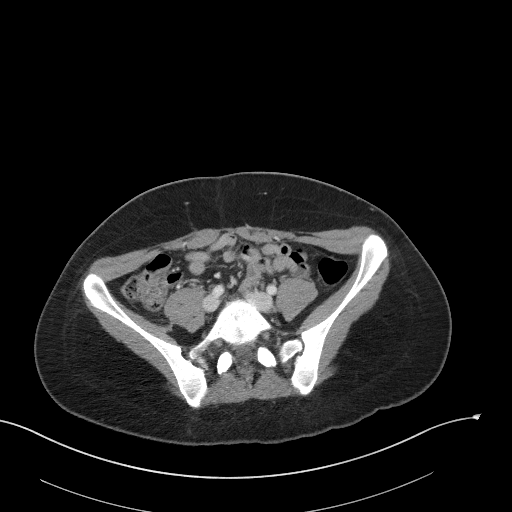
[im 44/88  soft-tissue]
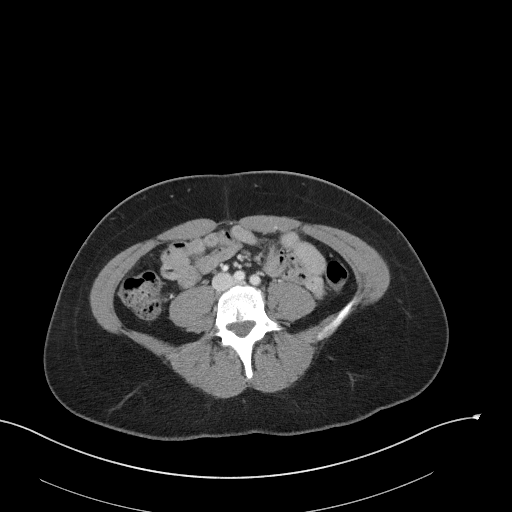
[im 51/88  soft-tissue]
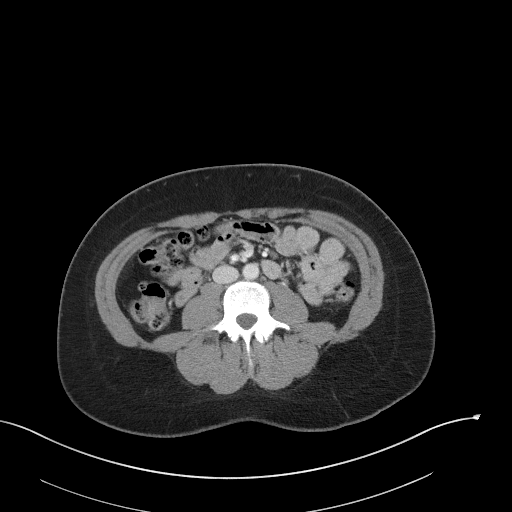
[im 59/88  soft-tissue]
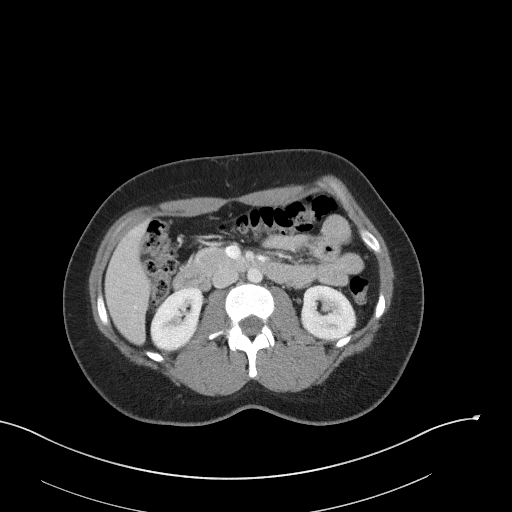
[im 59/88  bone]
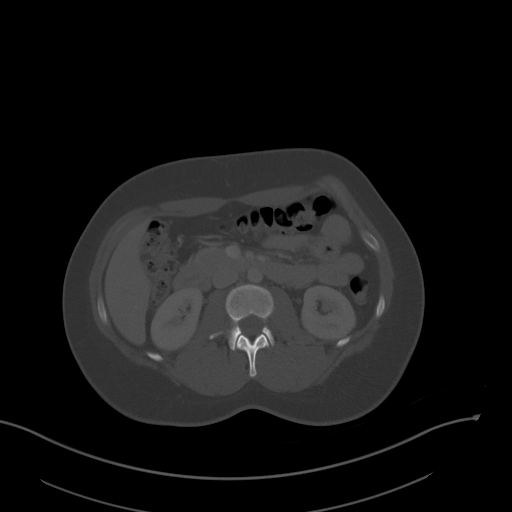
[im 62/88  soft-tissue]
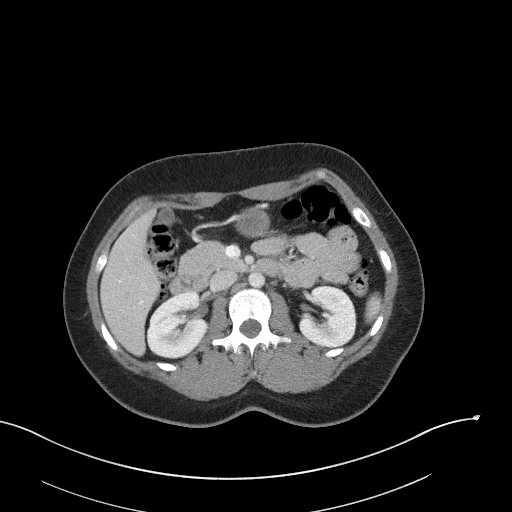
[im 69/88  soft-tissue]
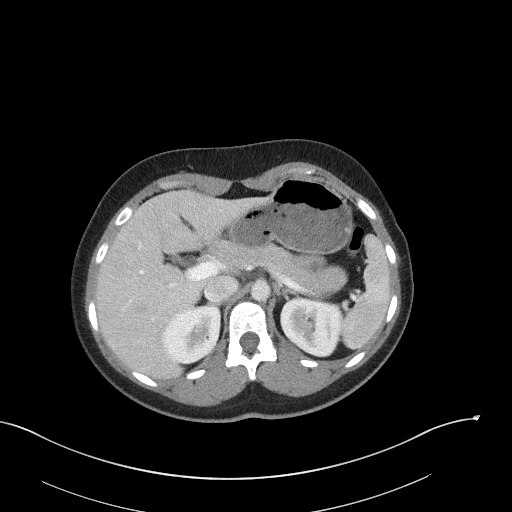
[im 77/88  soft-tissue]
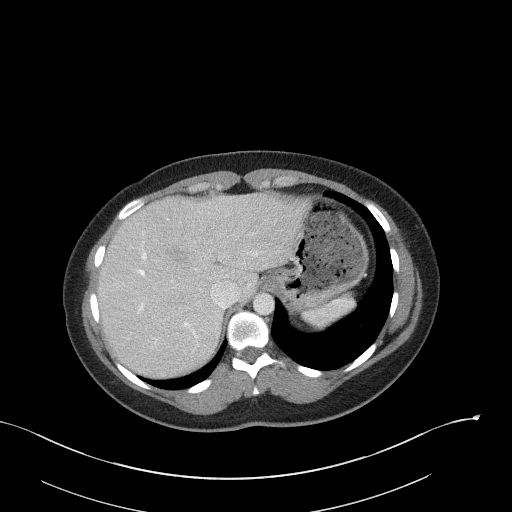
[im 84/88  soft-tissue]
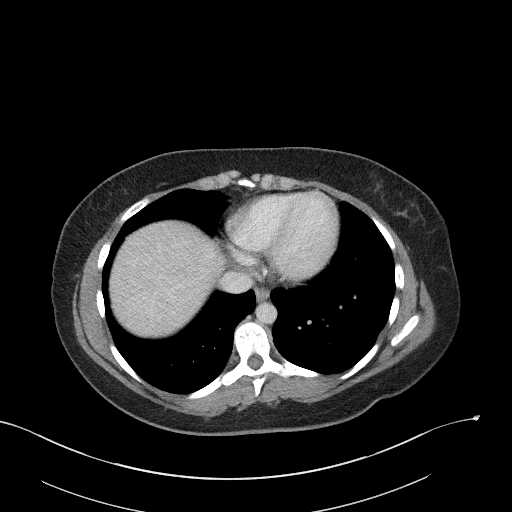

[Series 5: coronal st · coronal · 0.63mm/px · 3 of 96 slices shown]
[im 32/96  soft-tissue]
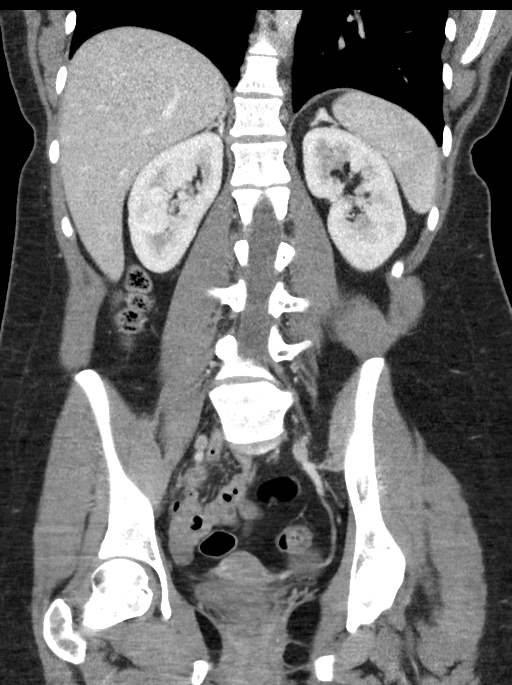
[im 43/96  soft-tissue]
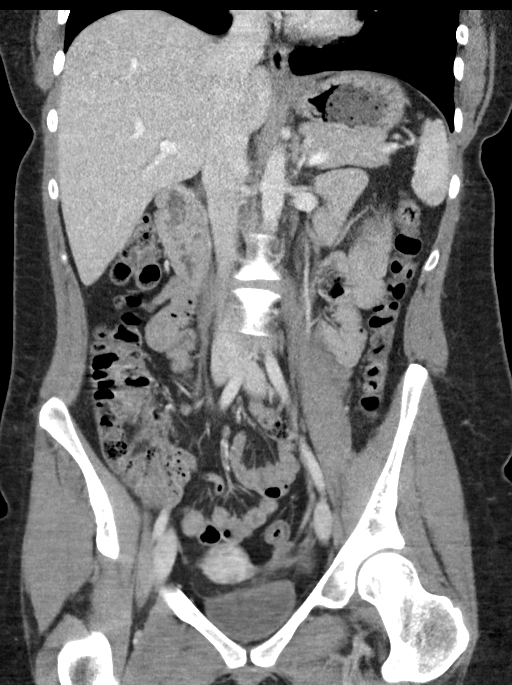
[im 53/96  soft-tissue]
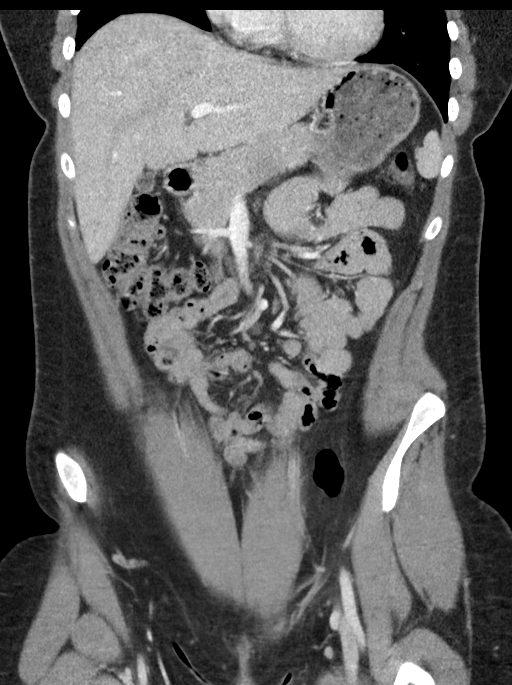

[16 of 46 positions shown; findings below may reference images not displayed]

FINDINGS: Lower chest: The visualized lung bases are clear.

No intra-abdominal free air.  Trace free fluid in the pelvis.

Hepatobiliary: No focal liver abnormality is seen. No gallstones,
gallbladder wall thickening, or biliary dilatation.

Pancreas: Unremarkable. No pancreatic ductal dilatation or
surrounding inflammatory changes.

Spleen: Normal in size without focal abnormality.

Adrenals/Urinary Tract: Adrenal glands are unremarkable. Kidneys are
normal, without renal calculi, focal lesion, or hydronephrosis.
Bladder is unremarkable.

Stomach/Bowel: There is no bowel obstruction or active inflammation.
The appendix is normal.

Vascular/Lymphatic: The abdominal aorta and IVC unremarkable. No
portal venous gas. There is no adenopathy.

Reproductive: The uterus is anteverted and grossly unremarkable.
There is a 3.2 x 3.3 cm low attenuating/cystic lesion in the left
ovary which may represent a dominant follicle/corpus luteum or a
cyst. There is slight enhancement of the peripheral wall of the
cystic structure. An infectious process is less likely but not
excluded. Ultrasound may provide better evaluation of the pelvic
structures. The right ovary is unremarkable

Other: None

Musculoskeletal: No acute or significant osseous findings.
IMPRESSION: 1. A 3.2 x 3.3 cm low attenuating/cystic lesion in the left ovary
may represent a dominant follicle/corpus luteum or a cyst. An
infectious process is less likely but not excluded. Ultrasound may
provide better evaluation of the pelvic structures.
2. No bowel obstruction. Normal appendix.

## 2023-03-16 ENCOUNTER — Encounter (HOSPITAL_COMMUNITY): Payer: Self-pay

## 2023-03-16 ENCOUNTER — Emergency Department (HOSPITAL_COMMUNITY)
Admission: EM | Admit: 2023-03-16 | Discharge: 2023-03-16 | Disposition: A | Discharge status: Home / Self Care | Payer: BC Managed Care – PPO | Attending: Emergency Medicine | Admitting: Emergency Medicine

## 2023-03-16 DIAGNOSIS — S0501XA Injury of conjunctiva and corneal abrasion without foreign body, right eye, initial encounter: Secondary | ICD-10-CM | POA: Insufficient documentation

## 2023-03-16 DIAGNOSIS — X58XXXA Exposure to other specified factors, initial encounter: Secondary | ICD-10-CM | POA: Insufficient documentation

## 2023-03-16 MED ORDER — FLUORESCEIN SODIUM 1 MG OP STRP
1.0000 | ORAL_STRIP | Freq: Once | OPHTHALMIC | Status: AC
  Administered 2023-03-16: 1 via OPHTHALMIC
  Filled 2023-03-16: qty 1

## 2023-03-16 MED ORDER — TETRACAINE HCL 0.5 % OP SOLN
2.0000 [drp] | Freq: Once | OPHTHALMIC | Status: AC
  Administered 2023-03-16: 2 [drp] via OPHTHALMIC
  Filled 2023-03-16: qty 4

## 2023-03-16 MED ORDER — CIPROFLOXACIN HCL 0.3 % OP OINT: TOPICAL_OINTMENT | Freq: Two times a day (BID) | OPHTHALMIC | 0 refills | Status: AC

## 2023-03-16 MED ORDER — CIPROFLOXACIN HCL 0.3 % OP SOLN
1.0000 [drp] | OPHTHALMIC | Status: DC
  Administered 2023-03-16: 1 [drp] via OPHTHALMIC
  Filled 2023-03-16: qty 2.5

## 2023-03-16 NOTE — ED Provider Notes (Signed)
[place] EMERGENCY DEPARTMENT AT [facility] Provider Note   CSN: [id number] Arrival date & time: 03/16/23  1629     History  Chief Complaint  Patient presents with   Eye Pain    Kristen Donovan is a 27 y.o. female.   Eye Pain     Patient is a 27 year old female presenting to the emergency department due to right eye pain.  Patient wears contacts at baseline, she has been in contact earlier when her palpable accidentally scratched her right eye.  She denies any change in her vision, loss of vision, diplopia.  She is currently not wearing any contacts or glasses.  She does have pain when she moves her eyeball.  Home Medications Prior to Admission medications   Medication Sig Start Date End Date Taking? Authorizing Provider  ciprofloxacin (CILOXAN) 0.3 % ophthalmic ointment Apply to eye 2 (two) times daily. 03/16/23  Yes Theron Arista, PA-C  buPROPion (WELLBUTRIN XL) 150 MG 24 hr tablet Take 150 mg by mouth daily.    [provider]  dicyclomine (BENTYL) 20 MG tablet Take 1 tablet (20 mg total) by mouth 2 (two) times daily. 11/26/20   McDonald, Mia A, PA-C  ibuprofen (ADVIL) 600 MG tablet Take 1 tablet (600 mg total) by mouth every 6 (six) hours as needed. 11/19/20   Fayrene Helper, PA-C  metoCLOPramide (REGLAN) 10 MG tablet Take 1 tablet (10 mg total) by mouth every 6 (six) hours. 11/26/20   McDonald, Mia A, PA-C  Multiple Vitamin (MULTIVITAMIN WITH MINERALS) TABS tablet Take 1 tablet by mouth daily.    [provider]  Norethindrone Acetate-Ethinyl Estradiol (LOESTRIN) 1.5-30 MG-MCG tablet Take 1 tablet by mouth daily. 09/14/20   [provider]  Spacer/Aero-Holding Rudean Curt Use with MDI as directed Patient not taking: Reported on 06/12/2020 09/27/12   Carmelina Dane, MD  albuterol (PROVENTIL HFA;VENTOLIN HFA) 108 (90 BASE) MCG/ACT inhaler Inhale 2 puffs into the lungs every 4 (four) hours as needed for wheezing (cough, shortness of breath or  wheezing.). 09/27/12 11/19/20  Carmelina Dane, MD  cimetidine (TAGAMET) 400 MG tablet Take 2 tablets (800 mg total) by mouth at bedtime. 09/27/12 11/19/20  Carmelina Dane, MD  cyproheptadine (PERIACTIN) 4 MG tablet Take 1 tablet (4 mg total) by mouth 4 (four) times daily. 09/27/12 11/19/20  Carmelina Dane, MD      Allergies    Patient has no known allergies.    Review of Systems   Review of Systems  Eyes:  Positive for pain.    Physical Exam Updated Vital Signs BP 127/85 (BP Location: Left Arm)   Pulse 78   Temp 98.2 F (36.8 C) (Oral)   Resp 16   Ht 5\' 6"  (1.676 m)   Wt 70.3 kg   LMP 02/16/2023   SpO2 98%   BMI 25.02 kg/m  Physical Exam Vitals and nursing note reviewed.  Constitutional:      General: She is not in acute distress.    Appearance: She is well-developed.  HENT:     Head: Normocephalic and atraumatic.     Mouth/Throat:     Comments: EOMI, injection lateral sclera versus conjunctivitis.  On fluorescein exam there is uptake laterally to the pupil, there is no ulceration over the cornea.  Normal pupil, IOP 18 Eyes:     Conjunctiva/sclera: Conjunctivae normal.  Cardiovascular:     Rate and Rhythm: Normal rate and regular rhythm.     Heart sounds: No murmur heard.  Pulmonary:     Effort: Pulmonary effort is normal. No respiratory distress.     Breath sounds: Normal breath sounds.  Abdominal:     Palpations: Abdomen is soft.     Tenderness: There is no abdominal tenderness.  Musculoskeletal:        General: No swelling.     Cervical back: Neck supple.  Skin:    General: Skin is warm and dry.     Capillary Refill: Capillary refill takes less than 2 seconds.  Neurological:     Mental Status: She is alert.  Psychiatric:        Mood and Affect: Mood normal.     ED Results / Procedures / Treatments   Labs (all labs ordered are listed, but only abnormal results are displayed) Labs Reviewed - No data to display  EKG None  Radiology No  results found.  Procedures Procedures    Medications Ordered in ED Medications  ciprofloxacin (CILOXAN) 0.3 % ophthalmic solution 1 drop (1 drop Right Eye Given 03/16/23 1737)  fluorescein ophthalmic strip 1 strip (1 strip Right Eye Given 03/16/23 1720)  tetracaine (PONTOCAINE) 0.5 % ophthalmic solution 2 drop (2 drops Right Eye Given 03/16/23 1722)    ED Course/ Medical Decision Making/ A&P                             Medical Decision Making Risk Prescription drug management.   Patient presents due to scratch to her eye.  She has a corneal abrasion under fluorescein exam, EOMI without nystagmus.  There is no external injury or laceration.  No teardrop pupil, IOP is within normal limits.  Lower suspicion for globe injury.  Will discharge home with ciprofloxacin drops and have her follow-up with ophthalmology in the next 48 hours if no improvement.  Return precautions were discussed with the patient who verbalized understanding and agreed with the plan.        Final Clinical Impression(s) / ED Diagnoses Final diagnoses:  Abrasion of right cornea, initial encounter    Rx / DC Orders ED Discharge Orders          Ordered    ciprofloxacin (CILOXAN) 0.3 % ophthalmic ointment  2 times daily        03/16/23 1728              Theron Arista, [place] 03/16/23 1939    Bethann Berkshire, MD 03/17/23 1253

## 2023-03-16 NOTE — ED Triage Notes (Addendum)
Pt's dog's nail got her in the outside of right eye as she was taking away a toy. Redness noted to sclera. Dog UTD on shots

## 2023-03-16 NOTE — Discharge Instructions (Signed)
You are seen today in the emergency department due to eye injury.  Use the antibiotic drops twice daily for the next 72 hours.  You should start to feel improvement within 48 hours.  I do want you to be seen by ophthalmology on Monday, if you have loss of vision, significant pain with vision, drainage from the eye, new or worsening symptoms return to the ED for further evaluation.

## 2023-03-22 ENCOUNTER — Ambulatory Visit: Payer: Self-pay

## 2023-03-22 NOTE — Telephone Encounter (Signed)
  Chief Complaint: neck pain Symptoms: stiff neck and shoulders, HA, sweating, nausea and thirst Frequency: yesterday Pertinent Negatives: Patient denies vomiting Disposition: ED /[] Urgent Care (no appt availability in office) / Appointment(In office/virtual)/  [facility]/ Home Care/ Refused Recommended Disposition /[] [facility]/  Follow-up with PCP Additional Notes: pt states she took 2 Kratom gummies yesterday and after about 20 mins developed sx. 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 Advised to go to ED for evaluation. Pt verbalized understanding.   Summary: Potential Serotonin Syndrome   Pt is experiencing stiffness in neck and shoulders, sweating, headache, severe nausea and thirst. Pt believes she has serotonin syndrome.         Reason for Disposition  [1] Stiff neck (can't put chin to chest) AND [2] headache  Answer Assessment - Initial Assessment Questions 1. ONSET: "When did the pain begin?"      Yesterday  2. LOCATION: "Where does it hurt?"      Neck and shoulders stiff  3. PATTERN "Does the pain come and go, or has it been constant since it started?"      Comes and goes  4. SEVERITY: "How bad is the pain?"  (Scale 1-10; or mild, moderate, severe)   - NO PAIN (0): no pain or only slight stiffness    - MILD (1-3): doesn't interfere with normal activities    - MODERATE (4-7): interferes with normal activities or awakens from sleep    - SEVERE (8-10):  excruciating pain, unable to do any normal activities      More discomfort than pain  8. NECK OVERUSE: "Any recent activities that involved turning or twisting the neck?"     no 9. OTHER SYMPTOMS: "Do you have any other symptoms?" (e.g., headache, fever, chest pain, difficulty breathing, neck swelling)     HA, sweating, severe nausea, thirst  Protocols used: Neck Pain or Stiffness-A-AH
